# Patient Record
Sex: Female | Born: 1995 | Race: White | Hispanic: No | Marital: Single | Smoking: Current every day smoker
Health system: Southern US, Community
[De-identification: ages and names within clinical notes are randomized; demographics above are authoritative.]

## PROBLEM LIST (undated history)

## (undated) DIAGNOSIS — S83207A Unspecified tear of unspecified meniscus, current injury, left knee, initial encounter: Secondary | ICD-10-CM

## (undated) DIAGNOSIS — L219 Seborrheic dermatitis, unspecified: Secondary | ICD-10-CM

## (undated) DIAGNOSIS — F319 Bipolar disorder, unspecified: Secondary | ICD-10-CM

---
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
## 2016-11-30 ENCOUNTER — Encounter: Payer: Self-pay | Admitting: Emergency Medicine

## 2016-11-30 ENCOUNTER — Other Ambulatory Visit: Payer: Self-pay

## 2016-11-30 ENCOUNTER — Emergency Department
Admission: EM | Admit: 2016-11-30 | Discharge: 2016-11-30 | Disposition: A | Discharge status: Other Acute Inpatient Hospital | Payer: Self-pay | Attending: Emergency Medicine | Admitting: Emergency Medicine

## 2016-11-30 ENCOUNTER — Inpatient Hospital Stay
Admission: AD | Admit: 2016-11-30 | Discharge: 2016-12-08 | DRG: 885 | Disposition: A | Discharge status: Home / Self Care | Payer: No Typology Code available for payment source | Attending: Psychiatry | Admitting: Psychiatry

## 2016-11-30 ENCOUNTER — Encounter: Payer: Self-pay | Admitting: Psychiatry

## 2016-11-30 DIAGNOSIS — F2081 Schizophreniform disorder: Secondary | ICD-10-CM

## 2016-11-30 DIAGNOSIS — R4182 Altered mental status, unspecified: Secondary | ICD-10-CM | POA: Insufficient documentation

## 2016-11-30 DIAGNOSIS — F1220 Cannabis dependence, uncomplicated: Secondary | ICD-10-CM | POA: Diagnosis present

## 2016-11-30 DIAGNOSIS — F3164 Bipolar disorder, current episode mixed, severe, with psychotic features: Principal | ICD-10-CM | POA: Diagnosis present

## 2016-11-30 DIAGNOSIS — R4689 Other symptoms and signs involving appearance and behavior: Secondary | ICD-10-CM

## 2016-11-30 DIAGNOSIS — F410 Panic disorder [episodic paroxysmal anxiety] without agoraphobia: Secondary | ICD-10-CM | POA: Diagnosis not present

## 2016-11-30 DIAGNOSIS — Z62810 Personal history of physical and sexual abuse in childhood: Secondary | ICD-10-CM | POA: Diagnosis present

## 2016-11-30 DIAGNOSIS — G4700 Insomnia, unspecified: Secondary | ICD-10-CM | POA: Diagnosis present

## 2016-11-30 HISTORY — DX: Cannabis dependence, uncomplicated: F12.20

## 2016-11-30 LAB — URINE DRUG SCREEN, QUALITATIVE (ARMC ONLY)
Amphetamines, Ur Screen: NOT DETECTED
Barbiturates, Ur Screen: NOT DETECTED
Benzodiazepine, Ur Scrn: NOT DETECTED
CANNABINOID 50 NG, UR ~~LOC~~: POSITIVE — AB
COCAINE METABOLITE, UR ~~LOC~~: NOT DETECTED
MDMA (Ecstasy)Ur Screen: NOT DETECTED
METHADONE SCREEN, URINE: NOT DETECTED
OPIATE, UR SCREEN: NOT DETECTED
Phencyclidine (PCP) Ur S: NOT DETECTED
Tricyclic, Ur Screen: NOT DETECTED

## 2016-11-30 LAB — COMPREHENSIVE METABOLIC PANEL
ALBUMIN: 4.8 g/dL (ref 3.5–5.0)
ALT: 13 U/L — ABNORMAL LOW (ref 14–54)
ANION GAP: 7 (ref 5–15)
AST: 21 U/L (ref 15–41)
Alkaline Phosphatase: 54 U/L (ref 38–126)
BUN: 7 mg/dL (ref 6–20)
CO2: 24 mmol/L (ref 22–32)
Calcium: 9.7 mg/dL (ref 8.9–10.3)
Chloride: 107 mmol/L (ref 101–111)
Creatinine, Ser: 0.56 mg/dL (ref 0.44–1.00)
GFR calc Af Amer: >60 mL/min (ref >60)
GFR calc non Af Amer: >60 mL/min (ref >60)
GLUCOSE: 115 mg/dL — AB (ref 65–99)
POTASSIUM: 3.1 mmol/L — AB (ref 3.5–5.1)
SODIUM: 138 mmol/L (ref 135–145)
Total Bilirubin: 1.2 mg/dL (ref 0.3–1.2)
Total Protein: 8.2 g/dL — ABNORMAL HIGH (ref 6.5–8.1)

## 2016-11-30 LAB — CBC
HEMATOCRIT: 38.3 % (ref 35.0–47.0)
HEMOGLOBIN: 12.9 g/dL (ref 12.0–16.0)
MCH: 29.9 pg (ref 26.0–34.0)
MCHC: 33.8 g/dL (ref 32.0–36.0)
MCV: 88.6 fL (ref 80.0–100.0)
Platelets: 191 10*3/uL (ref 150–440)
RBC: 4.32 MIL/uL (ref 3.80–5.20)
RDW: 13.6 % (ref 11.5–14.5)
WBC: 7.9 10*3/uL (ref 3.6–11.0)

## 2016-11-30 LAB — ETHANOL: Alcohol, Ethyl (B): <10 mg/dL (ref <10)

## 2016-11-30 LAB — POCT PREGNANCY, URINE: Preg Test, Ur: NEGATIVE

## 2016-11-30 MED ORDER — ACETAMINOPHEN 325 MG PO TABS
650.0000 mg | ORAL_TABLET | Freq: Four times a day (QID) | ORAL | Status: DC | PRN
  Administered 2016-12-04 – 2016-12-06 (×3): 650 mg via ORAL
  Filled 2016-11-30 (×3): qty 2

## 2016-11-30 MED ORDER — HALOPERIDOL 5 MG PO TABS: 10.0000 mg | ORAL_TABLET | Freq: Four times a day (QID) | ORAL | Status: DC | PRN

## 2016-11-30 MED ORDER — QUETIAPINE FUMARATE 100 MG PO TABS: 100.0000 mg | ORAL_TABLET | Freq: Every day | ORAL | Status: DC

## 2016-11-30 MED ORDER — ALUM & MAG HYDROXIDE-SIMETH 200-200-20 MG/5ML PO SUSP
30.0000 mL | ORAL | Status: DC | PRN
Start: 2016-11-30 — End: 2016-12-08

## 2016-11-30 MED ORDER — MAGNESIUM HYDROXIDE 400 MG/5ML PO SUSP
30.0000 mL | Freq: Every day | ORAL | Status: DC | PRN
  Administered 2016-12-06: 30 mL via ORAL
  Filled 2016-11-30: qty 30

## 2016-11-30 MED ORDER — LORAZEPAM 2 MG PO TABS: 2.0000 mg | ORAL_TABLET | Freq: Four times a day (QID) | ORAL | Status: DC | PRN

## 2016-11-30 MED ORDER — ZIPRASIDONE MESYLATE 20 MG IM SOLR
20.0000 mg | Freq: Once | INTRAMUSCULAR | Status: AC
  Administered 2016-11-30: 20 mg via INTRAMUSCULAR

## 2016-11-30 MED ORDER — HALOPERIDOL LACTATE 5 MG/ML IJ SOLN
10.0000 mg | Freq: Four times a day (QID) | INTRAMUSCULAR | Status: DC | PRN
  Administered 2016-11-30 – 2016-12-01 (×2): 10 mg via INTRAMUSCULAR
  Filled 2016-11-30 (×2): qty 2

## 2016-11-30 MED ORDER — LORAZEPAM 2 MG/ML IJ SOLN
2.0000 mg | Freq: Once | INTRAMUSCULAR | Status: AC
  Administered 2016-11-30: 2 mg via INTRAMUSCULAR

## 2016-11-30 MED ORDER — DIPHENHYDRAMINE HCL 25 MG PO CAPS: 50.0000 mg | ORAL_CAPSULE | Freq: Four times a day (QID) | ORAL | Status: DC | PRN

## 2016-11-30 MED ORDER — LITHIUM CARBONATE 300 MG PO CAPS
300.0000 mg | ORAL_CAPSULE | Freq: Three times a day (TID) | ORAL | Status: DC
  Administered 2016-12-01: 300 mg via ORAL
  Filled 2016-11-30: qty 1

## 2016-11-30 MED ORDER — DIPHENHYDRAMINE HCL 50 MG/ML IJ SOLN
50.0000 mg | Freq: Four times a day (QID) | INTRAMUSCULAR | Status: DC | PRN
  Administered 2016-11-30 – 2016-12-01 (×2): 50 mg via INTRAMUSCULAR
  Filled 2016-11-30 (×2): qty 1

## 2016-11-30 MED ORDER — LORAZEPAM 2 MG/ML IJ SOLN
2.0000 mg | Freq: Four times a day (QID) | INTRAMUSCULAR | Status: DC | PRN
  Administered 2016-11-30 – 2016-12-01 (×2): 2 mg via INTRAMUSCULAR
  Filled 2016-11-30 (×2): qty 1

## 2016-11-30 MED ORDER — ZIPRASIDONE MESYLATE 20 MG IM SOLR
20.0000 mg | Freq: Once | INTRAMUSCULAR | Status: AC
  Administered 2016-11-30: 20 mg via INTRAMUSCULAR
  Filled 2016-11-30: qty 20

## 2016-11-30 NOTE — ED Notes (Signed)
Ambulatory to the BR - pt urinating in the bed   Encouraged her to go to the BR  - she walked independently to the BR

## 2016-11-30 NOTE — ED Provider Notes (Signed)
[facility] Emergency Department Provider Note   ____________________________________________   First MD Initiated Contact with Patient 11/30/16 0425     (approximate)  I have reviewed the triage vital signs and the nursing notes.   HISTORY  Chief Complaint Altered Mental Status    HPI Ruth Griffith is a 21 y.o. female Who comes into the hospital today with bizarre behavior. The patient goes to school in [place] and was picked up from school this morning. Since then she's been talking out of her head. The patient's parents state that someone gave her some drug but they're unsure exactly what it is. The patient states that she has been drinking as well. The patient has been yelling in saying that she needs to see a man about a truck. She also states that she knows about God and is very agitated. The patient states that she needs to find her brother because her brother is the only one who knows her. She also keeps saying that she has Engineer, manufacturing. The patient does not have fluid speech or fluid or process. According to the patient's mother she hasn't slept since Thursday and has been running around her dorm's. She states that she's been speaking this way since yesterday morning at about 8:30 AM. The patient was brought in for evaluation.   History reviewed. No pertinent past medical history.  There are no active problems to display for this patient.   History reviewed. No pertinent surgical history.  Prior to Admission medications   Not on File    Allergies Patient has no known allergies.  No family history on file.  Social History Social History  Substance Use Topics  . Smoking status: Not on file  . Smokeless tobacco: Not on file  . Alcohol use No    Review of Systems  unable to assess due to patient's agitated and altered state. ____________________________________________   PHYSICAL EXAM:  VITAL SIGNS: ED Triage Vitals  Enc  Vitals Group     BP 11/30/16 0437 (!) 157/95     Pulse Rate 11/30/16 0437 83     Resp 11/30/16 0437 (!) 26     Temp 11/30/16 0437 (!) 97.4 F (36.3 C)     Temp Source 11/30/16 0437 Oral     SpO2 11/30/16 0437 100 %     Weight 11/30/16 0438 148 lb (67.1 kg)     Height 11/30/16 0438 5\' 8"  (1.727 m)     Head Circumference --      Peak Flow --      Pain Score 11/30/16 0436 0     Pain Loc --      Pain Edu? --      Excl. in GC? --     Constitutional: Alert and disoriented, the patient's disheveled appearing and very agitated yelling and screaming and continually trying to jump out of bed. Eyes: Conjunctivae are normal. PERRL. EOMI. Head: Atraumatic. Nose: No congestion/rhinnorhea. Mouth/Throat: Mucous membranes are moist.  Oropharynx non-erythematous. Cardiovascular: Normal rate, regular rhythm. Grossly normal heart sounds.  Good peripheral circulation. Respiratory: Normal respiratory effort.  No retractions. Lungs CTAB. Gastrointestinal: Soft and nontender. No distention. positive bowel sounds Musculoskeletal: No lower extremity tenderness nor edema.  . Neurologic:  Normal speech and language.  Skin:  Skin is warm, dry and intact.  Psychiatric: Mood and affect are normal.   ____________________________________________   LABS (all labs ordered are listed, but only abnormal results are displayed)  Labs Reviewed  COMPREHENSIVE METABOLIC PANEL -  Abnormal; Notable for the following:       Result Value   Potassium 3.1 (*)    Glucose, Bld 115 (*)    Total Protein 8.2 (*)    ALT 13 (*)    All other components within normal limits  URINE DRUG SCREEN, QUALITATIVE ([facility] ONLY) - Abnormal; Notable for the following:    Cannabinoid 50 Ng, Ur [place] POSITIVE (*)    All other components within normal limits  CBC  ETHANOL  CBG MONITORING, ED  POCT PREGNANCY, URINE   ____________________________________________  EKG  none ____________________________________________  RADIOLOGY  No  results found.  ____________________________________________   PROCEDURES  Procedure(s) performed: None  Procedures  Critical Care performed: No  ____________________________________________   INITIAL IMPRESSION / ASSESSMENT AND PLAN / ED COURSE  As part of my medical decision making, I reviewed the following data within the electronic MEDICAL RECORD NUMBER Notes from prior ED visits and [place] Controlled Substance Database   This is a 21 year old female who comes into the hospital today with some agitation. The patient comes in and she has pressured speech with word salad and flight of ideas. The patient doesn't make any sense in her speech and she is very agitated. She keeps trying to come off the bed. the patient would not answer questions appropriately and did not make sense. I did give the patient a dose of Geodon and Ativan to help her be calm. I did involuntarily commit the patient and we did check her blood work.  My differential diagnosis does include substance abuse, alcoholism, acute psychotic break, manic episode.  The patient's blood work is unremarkable. I will await specialist consult      ____________________________________________   FINAL CLINICAL IMPRESSION(S) / ED DIAGNOSES  Final diagnoses:  Altered mental status, unspecified altered mental status type  Abnormal behavior      NEW MEDICATIONS STARTED DURING THIS VISIT:  New Prescriptions   No medications on file     Note:  This document was prepared using Dragon voice recognition software and may include unintentional dictation errors.    Rebecka ApleyWebster, Allison P, MD 11/30/16 [phone]

## 2016-11-30 NOTE — ED Notes (Signed)
BEHAVIORAL HEALTH ROUNDING Patient sleeping: Yes.   Patient alert and oriented: eyes closed  Appears to be asleep Behavior appropriate: Yes.  ; If no, describe:  Nutrition and fluids offered: Yes  Toileting and hygiene offered: sleeping Sitter present: q 15 minute observations and security monitoring Law enforcement present: yes  ODS 
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
## 2016-11-30 NOTE — ED Notes (Signed)
Pt's mother has called requesting to speak with her daughter or receive a call from the psychiatrist  - psychiatrist is currently in her room  Mothers number taken by the secretary   Ruth Griffith  [phone]

## 2016-11-30 NOTE — ED Notes (Signed)
BEHAVIORAL HEALTH ROUNDING Patient sleeping: No. Patient alert and oriented: yes Behavior appropriate: Yes.  ; If no, describe:  Nutrition and fluids offered: yes Toileting and hygiene offered: Yes  Sitter present: q15 minute observations and security  monitoring Law enforcement present: Yes  ODS  

## 2016-11-30 NOTE — ED Notes (Signed)
Awake and alert.  Patient confused, tearful.  States "I am confused.  I am worried my mom won't recognize me and won't know who I am."

## 2016-11-30 NOTE — ED Triage Notes (Signed)
Pt arrives via [facility] with c/o AMS. Pt is repeating that she is "savage" and that she "loves bacon". Per EMS, CBG 123. [place] and her parents called EMS.

## 2016-11-30 NOTE — ED Notes (Signed)
Patient moved to ED 23 for safety.

## 2016-11-30 NOTE — ED Notes (Signed)
Pt has been up and down from the bed - taking her clothes off and then acting as if she cannot remember how to put her clothes back  - walking to the BR then acting as if she has forgotten how to walk  Large BM in the restroom then stating  "I am not pregnant anymore because I just shit"   Bizarre - irratic behavior observed  Med to be administered

## 2016-11-30 NOTE — ED Notes (Addendum)
Admission EKG completed by this RN. This RN and security to walk pt to Lower Level. [facility] not [facility] nor in ED to be sent to lower level.

## 2016-11-30 NOTE — BH Assessment (Signed)
Pt sleeping and unable to participate in consult at this time. Per pt's RN Erskine Squibb(Jane) pt received IM medications. TTS to assess pt at later time.

## 2016-11-30 NOTE — ED Notes (Signed)
Pt is moving from laying on bed to standing and is repeatedly taking clothes off; pt has been asked multiple times to keep clothes on and relax on bed and will being going down to her admitting room down stairs.

## 2016-11-30 NOTE — Tx Team (Addendum)
Initial Treatment Plan 11/30/2016 11:44 PM Lenon OmsJenna Margie Griffith UEA:[id number]    PATIENT STRESSORS: Substance abuse   PATIENT STRENGTHS: Ability for insight Average or above average intelligence Capable of independent living Communication skills Physical Health Supportive family/friends Other: In college    PATIENT IDENTIFIED PROBLEMS: Substance abuse - smoked marijuana                     DISCHARGE CRITERIA:  Ability to meet basic life and health needs Adequate post-discharge living arrangements Improved stabilization in mood, thinking, and/or behavior  PRELIMINARY DISCHARGE PLAN: Outpatient therapy Return to previous living arrangement Return to previous work or school arrangements  PATIENT/FAMILY INVOLVEMENT: This treatment plan was not reviewed with this patient because she was disorganized and medicated resulting in her falling asleep.  Lenon OmsJenna Margie Griffith,.  The patient was not  been given the opportunity to ask questions and make suggestions.  Rex KrasJoanne  Mialynn Shelvin, RN 11/30/2016, 11:44 PM
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
## 2016-11-30 NOTE — ED Notes (Signed)
Awake  - ambulating in her room -  She had to be reminded to put her pants on before walking into the hallway   She independently put her clothes back on -  She then ambulated to the BR and turned the shower water on - soaking herself-  Clothing changed  - she independently changed herself and ambulated back to her room

## 2016-11-30 NOTE — Progress Notes (Signed)
Received Ruth Griffith from the ED, she is alert and very disorganized with difficulty to redirect. She was directed to her room, took a shower, constant calling via nurses call light, talked with her mother, walking around the unit in a disorganized manner, disruptive in the day room, and increased verbal tone. Her room was moved closer to the nurses unit and she received IM Haldol, Benadryl and Ativan per order. Security monitored her until the medication became effective and she fell asleep. Her admission data was obtained from the chart

## 2016-11-30 NOTE — BH Assessment (Addendum)
TTS interview completed. [facility] by Dr. Toni Amendlapacs.  Attending Physician will be Dr. Jennet MaduroPucilowska.   Patient has been [facility] to room 313, [facility] Gwen.   ER staff is aware of the admission Rivka Barbara( Glenda, ER Sect.; Dr. Darnelle CatalanMalinda, ER MD; Amy Patient's Nurse & Mertie ClauseJeanelle Patient Access).
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
## 2016-11-30 NOTE — ED Notes (Signed)
She has been knocking on her door constantly since returning to her room from the [facility]  - she requests a drink  - juice in plastic cup with no lid offered due to she chewed up the styrofoam cup we provided earlier  Continue to monitor closely

## 2016-11-30 NOTE — BH Assessment (Signed)
Pt BMU bed assignment changed from 313 to 312 per charge RN, Dedra SkeensGwen.

## 2016-11-30 NOTE — ED Provider Notes (Signed)
patient is having confused thinking she pulled her pants down in the room on the bed and urinated all over the place and then when she got to the bathroom was screaming that she had to go poop and having a lot of confused speech as well. I will give her another   Arnaldo NatalMalinda, Forestine Macho F, MD 11/30/16 [id number]

## 2016-12-01 DIAGNOSIS — F29 Unspecified psychosis not due to a substance or known physiological condition: Secondary | ICD-10-CM

## 2016-12-01 LAB — LIPID PANEL
CHOL/HDL RATIO: 2.2 ratio
CHOLESTEROL: 151 mg/dL (ref 0–200)
HDL: 68 mg/dL (ref >40)
LDL Cholesterol: 69 mg/dL (ref 0–99)
Triglycerides: 71 mg/dL (ref <150)
VLDL: 14 mg/dL (ref 0–40)

## 2016-12-01 LAB — HEMOGLOBIN A1C
Hgb A1c MFr Bld: 5 % (ref 4.8–5.6)
MEAN PLASMA GLUCOSE: 96.8 mg/dL

## 2016-12-01 LAB — TSH: TSH: 0.829 u[IU]/mL (ref 0.350–4.500)

## 2016-12-01 MED ORDER — OLANZAPINE 5 MG PO TBDP
15.0000 mg | ORAL_TABLET | Freq: Two times a day (BID) | ORAL | Status: DC
  Administered 2016-12-02: 15 mg via ORAL
  Filled 2016-12-01 (×2): qty 3

## 2016-12-01 MED ORDER — OLANZAPINE 5 MG PO TBDP
15.0000 mg | ORAL_TABLET | Freq: Once | ORAL | Status: AC
  Administered 2016-12-01: 15 mg via ORAL
  Filled 2016-12-01: qty 3

## 2016-12-01 MED ORDER — TEMAZEPAM 15 MG PO CAPS
15.0000 mg | ORAL_CAPSULE | Freq: Every day | ORAL | Status: DC
  Filled 2016-12-01: qty 1

## 2016-12-01 MED ORDER — ARIPIPRAZOLE 5 MG PO TABS
5.0000 mg | ORAL_TABLET | Freq: Every day | ORAL | Status: DC
  Filled 2016-12-01: qty 1

## 2016-12-01 MED ORDER — OLANZAPINE 10 MG IM SOLR: 10.0000 mg | Freq: Every day | INTRAMUSCULAR | Status: DC | PRN

## 2016-12-01 NOTE — [facility] Group Notes (Signed)
[facility] LCSW Group Therapy Note  Date/Time: 12/01/16, 1400  Type of Therapy/Topic:  Group Therapy:  Feelings about Diagnosis  Participation Level:  Active   Mood:pleasant   Description of Group:    This group will allow patients to explore their thoughts and feelings about diagnoses they have received. Patients will be guided to explore their level of understanding and acceptance of these diagnoses. Facilitator will encourage patients to process their thoughts and feelings about the reactions of others to their diagnosis, and will guide patients in identifying ways to discuss their diagnosis with significant others in their lives. This group will be process-oriented, with patients participating in exploration of their own experiences as well as giving and receiving support and challenge from other group members.   Therapeutic Goals: 1. Patient will demonstrate understanding of diagnosis as evidence by identifying two or more symptoms of the disorder:  2. Patient will be able to express two feelings regarding the diagnosis 3. Patient will demonstrate ability to communicate their needs through discussion and/or role plays  Summary of Patient Progress: Pt was very talkative in group and was quite knowledgeable about symptoms of bipolar disorder.  Pt identified her diagnoses as bipolar disorder and PTSD.  Pt was talking with other patients and had some difficulty staying focused.        Therapeutic Modalities:   Cognitive Behavioral Therapy Brief Therapy Feelings Identification   Daleen SquibbGreg Ruvi Fullenwider, LCSW
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
## 2016-12-01 NOTE — Progress Notes (Signed)
Recreation Therapy Notes  INPATIENT RECREATION THERAPY ASSESSMENT  Patient Details Name: Ruth Griffith MRN: [id number] DOB: 02/24/1993 Today's Date: 12/01/2016  Patient Stressors: Family, Relationship, Friends, Work, School   Patient states that she is concerned about her brother and is pregnant.   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)  Personal Challenges: Anger, Communication, Concentration, Expressing Yourself, Self-Esteem/Confidence  Leisure Interests (2+):   (Cleaning, Helping others)  Awareness of Community Resources:  No  Community Resources:   N/A  Current Use:  N/A  If no, Barriers?:  N/A  Patient Strengths:  Pension scheme managerainting, Shopping  Patient Identified Areas of Improvement:  Talking to others  Current Recreation Participation:  None  Patient Goal for Hospitalization:  Focus on self  [place]  [place] Residence:  [place]   Current SI (including self-harm):  No  Current HI:  No  Consent to Intern Participation: N/A   Deshay Kirstein 12/01/2016, 11:15 AM

## 2016-12-01 NOTE — Progress Notes (Signed)
Pt was exit seeking and refusing redirection, she is hperactive, pacing up and down the hall She emptied her trash on the floor. At 2000 was given IM Haldol, benadryl and ativan. She had snack and was in bed sleeping by 2100. Was not awakened for HS meds.

## 2016-12-01 NOTE — Progress Notes (Signed)
Ruth Griffith slept after the medication injection and remained asleep, she slept 7 hrs and 15 min.

## 2016-12-01 NOTE — Progress Notes (Signed)
Recreation Therapy Notes   Time: 9:30am  Location: Craft Room  Behavioral response: Hyperactive  Intervention Topic: Team building  Discussion/Intervention: Group content on today was focused on team building. The group identified what team building is. Individuals described who is a part of their team. Patients expressed why they thought team building is important. The group stated reasons why they thought it was easier to work with a Comptrollersmaller/larger team. Individuals discussed some positives and negatives of working with a team. Patients gave examples of past experiences they had while working with a team. The group participated in the intervention "Save the TocoBalls", patients were in groups and had to keep the balls on the surface given.   Clinical Observations/Feedback:  Patient came to group and was engaged with staff and peers. She identified team building as building as a team. Individual expressed that she would prefer to work in a bigger team to get more done. Patient became hyperactive during group and had to redirected by staff several times.   Emon Lance LRT/CTRS        Athol Bolds 12/01/2016 11:38 AM

## 2016-12-01 NOTE — Progress Notes (Signed)
Patient ID: Ruth Griffith, female   DOB: 02/24/1993, [age] y.o.   MRN: [id number]  Ruth Griffith is now giddy, intrussive, disorganized. She has been going to other patients' rooms taking their belongings. She is singing loudly. She refused lab work and medications twice today.   We will initiate forced medications protocol and start Zyprexa Zydis 15 mg twice daily. We will give Zyprexa 10 mg IM if oral Zyprexa is refused.

## 2016-12-01 NOTE — [facility] Suicide Risk Assessment (Signed)
[facility] Admission Suicide Risk Assessment   Nursing information obtained from:    Demographic factors:    Current Mental Status:    Loss Factors:    Historical Factors:    Risk Reduction Factors:     Total Time spent with patient: 1 hour Principal Problem: Schizophrenia spectrum disorder with psychotic disorder type not yet determined [facility]) Diagnosis:   Patient Active Problem List   Diagnosis Date Noted  . Schizophreniform disorder ([facility]) [F20.81] 11/30/2016  . Schizophrenia spectrum disorder with psychotic disorder type not yet determined ([facility]) [F29] 11/30/2016  . Cannabis use disorder, moderate, dependence ([facility]) [F12.20] 11/30/2016   Subjective Data: psychotic break.  Continued Clinical Symptoms:    The "Alcohol Use Disorders Identification Test", Guidelines for Use in Primary Care, Second Edition.  World Science writer [facility]). Score between 0-7:  no or low risk or alcohol related problems. Score between 8-15:  moderate risk of alcohol related problems. Score between 16-19:  high risk of alcohol related problems. Score 20 or above:  warrants further diagnostic evaluation for alcohol dependence and treatment.   CLINICAL FACTORS:   Bipolar Disorder:   Mixed State Currently Psychotic   Musculoskeletal: Strength & Muscle Tone: within normal limits Gait & Station: normal Patient leans: N/A  Psychiatric Specialty Exam: Physical Exam  Nursing note and vitals reviewed. Psychiatric: Her affect is labile. Her speech is delayed. Thought content is paranoid and delusional. Cognition and memory are impaired. She expresses impulsivity.    Review of Systems  Psychiatric/Behavioral: Positive for hallucinations and substance abuse. The patient is nervous/anxious.   All other systems reviewed and are negative.   Blood pressure (!) 153/99, pulse 97, temperature 98 F (36.7 C), temperature source Oral, resp. rate 20, height 5\' 8"  (1.727 m), weight 60.8 kg (134 lb).Body mass index is  20.37 kg/m.  General Appearance: Fairly Groomed  Eye Contact:  Minimal  Speech:  Slow  Volume:  Decreased  Mood:  Anxious  Affect:  Congruent  Thought Process:  Disorganized and Descriptions of Associations: Tangential  Orientation:  Full (Time, Place, and Person)  Thought Content:  Delusions, Hallucinations: Auditory and Paranoid Ideation  Suicidal Thoughts:  No  Homicidal Thoughts:  No  Memory:  Immediate;   Poor Recent;   Poor Remote;   Poor  Judgement:  Poor  Insight:  Lacking  Psychomotor Activity:  Increased  Concentration:  Concentration: Poor and Attention Span: Poor  Recall:  Poor  Fund of Knowledge:  Fair  Language:  Fair  Akathisia:  No  Handed:  Right  AIMS (if indicated):     Assets:  Communication Skills Desire for Improvement Financial Resources/Insurance Housing Physical Health Resilience Social Support  ADL's:  Intact  Cognition:  WNL  Sleep:  Number of Hours: 7.15      COGNITIVE FEATURES THAT CONTRIBUTE TO RISK:  None    SUICIDE RISK:   Moderate:  Frequent suicidal ideation with limited intensity, and duration, some specificity in terms of plans, no associated intent, good self-control, limited dysphoria/symptomatology, some risk factors present, and identifiable protective factors, including available and accessible social support.  PLAN OF CARE: Hospital admission, medication, substance abuse counseling, discharge planning,  Ms. Lamar Benes is a 21 year old female with no past psychiatric history admitted for psychotic break.  # Agitation -Haldol, Ativan and Benadryl PO and IM are available  # Psychosis, likely bipolar mania - started Lithium 300 mg TID - started Abilify 5 mg today - Head CT scan  # Insomnia - we start Restoril 15  mg nightly  # substance abuse - positive for cannabis  # Metabolic syndrome monitoring - Lipid panel, TSH and HgbA1C are pending - EKG, pending  - Pregnancy test is negative  # Disposition - Discharge  with family - She needs psychiatric appointment  I certify that inpatient services furnished can reasonably be expected to improve the patient's condition.   Kristine LineaJolanta Bret Vanessen, MD 12/01/2016, 8:51 AM

## 2016-12-01 NOTE — Plan of Care (Signed)
Problem: Education: Goal: Will be free of psychotic symptoms Outcome: Not Progressing Patient remains mentally unstable at this time. Goal: Knowledge of the prescribed therapeutic regimen will improve Outcome: Not Progressing Patient is refusing to take her meds.  Problem: Coping: Goal: Ability to cope will improve Outcome: Not Progressing Patient is not coping well in the environment. Goal: Ability to verbalize feelings will improve Outcome: Not Progressing Patient is not able to verbalize feelings at this time. Confusion is noted.  Problem: Safety: Goal: Ability to remain free from injury will improve Outcome: Progressing Patient remains free from injury.  Problem: Education: Goal: Mental status will improve Outcome: Not Progressing Patient continues to display mental instability and confusion.

## 2016-12-01 NOTE — Progress Notes (Signed)
Patient continues to be confused with bizarre behavior. Oriented to self only at this time. Medication not taken by patient this a.m. Continues to say, "This is bad for my body." Disorganized thought content noted. Ambulates the unit without any problems. Encouraged to attend group but participation was not done. Ate about 25% of breakfast this morning. Will continue to monitor for safety.

## 2016-12-01 NOTE — [facility]HH Group Notes (Signed)
LCSW Group Therapy Note 12/01/2016 9:00am  Type of Therapy and Topic:  Group Therapy:  Setting Goals  Participation Level:  Active  Description of Group: In this process group, patients discussed using strengths to work toward goals and address challenges.  Patients identified two positive things about themselves and one goal they were working on.  Patients were given the opportunity to share openly and support each other's plan for self-empowerment.  The group discussed the value of gratitude and were encouraged to have a daily reflection of positive characteristics or circumstances.  Patients were encouraged to identify a plan to utilize their strengths to work on current challenges and goals.  Therapeutic Goals 1. Patient will verbalize personal strengths/positive qualities and relate how these can assist with achieving desired personal goals 2. Patients will verbalize affirmation of peers plans for personal change and goal setting 3. Patients will explore the value of gratitude and positive focus as related to successful achievement of goals 4. Patients will verbalize a plan for regular reinforcement of personal positive qualities and circumstances.  Summary of Patient Progress:  Pt is unable to meet therapeutic goals at this time.  She remains manic.  Requiring some redirection from touching others, put her hand on another Pt's shoulder.  In and out of group, several times.  Some inappropriate conversations about bodily functions, etc.     Therapeutic Modalities Cognitive Behavioral Therapy Motivational Interviewing    Glennon MacSara P Mischell [place], LCSW 12/01/2016 11:23 AM

## 2016-12-02 MED ORDER — OLANZAPINE 5 MG PO TBDP
15.0000 mg | ORAL_TABLET | Freq: Two times a day (BID) | ORAL | Status: DC
  Administered 2016-12-02 – 2016-12-07 (×10): 15 mg via ORAL
  Filled 2016-12-02 (×10): qty 3

## 2016-12-02 MED ORDER — OLANZAPINE 10 MG IM SOLR
10.0000 mg | Freq: Two times a day (BID) | INTRAMUSCULAR | Status: DC
  Filled 2016-12-02: qty 10

## 2016-12-02 NOTE — Progress Notes (Signed)
Recreation Therapy Notes   Date: 10.31.18  Time: 3:00pm  Location: Outside  Behavioral response: N/A  Group Type: Leisure   Participation level: N/A  Communication: Patient did not attend group.  Comments: N/A  [name] LRT/CTRS        [name] 12/02/2016 4:03 PM

## 2016-12-02 NOTE — [facility] Group Notes (Signed)
  [facility] LCSW Group Therapy Note  Date/Time: 12/02/16, 1300  Type of Therapy/Topic:  Group Therapy:  Emotion Regulation  Participation Level:  Active   Mood: pleasant  Description of Group:    The purpose of this group is to assist patients in learning to regulate negative emotions and experience positive emotions. Patients will be guided to discuss ways in which they have been vulnerable to their negative emotions. These vulnerabilities will be juxtaposed with experiences of positive emotions or situations, and patients challenged to use positive emotions to combat negative ones. Special emphasis will be placed on coping with negative emotions in conflict situations, and patients will process healthy conflict resolution skills.  Therapeutic Goals: 1. Patient will identify two positive emotions or experiences to reflect on in order to balance out negative emotions:  2. Patient will label two or more emotions that they find the most difficult to experience:  3. Patient will be able to demonstrate positive conflict resolution skills through discussion or role plays:   Summary of Patient Progress: Pt identified fear and shame as emotions that are difficult for her to experience.  Pt did make a number of comments to the group discussion, some more on topic and some off topic.  Pt was attentive in group but had to leave 2-3 times saying she had to use the bathroom and then returning.         Therapeutic Modalities:   Cognitive Behavioral Therapy Feelings Identification Dialectical Behavioral Therapy  Daleen SquibbGreg Annsley Akkerman, LCSW

## 2016-12-02 NOTE — Plan of Care (Signed)
Problem: Coping: Goal: Ability to cope will improve Outcome: Not Progressing Patient is delusional and disorganized.  Problem: Safety: Goal: Ability to remain free from injury will improve Outcome: Progressing Remained free of injury.

## 2016-12-02 NOTE — Patient Care Conference (Signed)
Patient was sad,tearful and very disorganized this morning.Took her medicine with much encouragement.Attended some groups.Took evening medicine with no problem.Appropriate with staff & peers.Appetite and energy level good.Support and encouragement given.

## 2016-12-02 NOTE — Progress Notes (Signed)
Recreation Therapy Notes  Date: 10.31.18  Time: 9:30am  Location: Craft Room  Behavioral response: Hyperactive  Intervention Topic: Emotions  Discussion/Intervention: Group content on today was focused on emotions. The group identified what emotions are and why it is important to have emotions. Patients expressed some positive and negative emotions. Individuals gave some past experiences on how they normally dealt with emotions in the past. The group described some positive ways to deal with emotions in the future. Patients participated in the intervention "Shaded Emotions" where a story was read to the group as they colored in what feelings they were feeling into a coloring sheet.  Clinical Observations/Feedback:  Patient came to group and had to be redirected by staff several times to stay on topic and focus. Individual described emotions as how someone feels. She identified she feels positive emotions when she is helping others.  Analisa Sledd LRT/CTRS        Delancey Moraes 12/02/2016 11:40 AM

## 2016-12-02 NOTE — Tx Team (Signed)
Interdisciplinary Treatment and Diagnostic Plan Update  12/02/2016 Time of Session: 10:30am Jannatul Wojdyla MRN: [id number]  Principal Diagnosis: Schizophrenia spectrum disorder with psychotic disorder type not yet determined [facility])  Secondary Diagnoses: Principal Problem:   Schizophrenia spectrum disorder with psychotic disorder type not yet determined (HCC) Active Problems:   Cannabis use disorder, moderate, dependence (HCC)   Current Medications:  Current Facility-Administered Medications  Medication Dose Route Frequency Provider Last Rate Last Dose  . acetaminophen (TYLENOL) tablet 650 mg  650 mg Oral Q6H PRN Clapacs, John T, MD      . alum & mag hydroxide-simeth (MAALOX/MYLANTA) 200-200-20 MG/5ML suspension 30 mL  30 mL Oral Q4H PRN Clapacs, John T, MD      . diphenhydrAMINE (BENADRYL) capsule 50 mg  50 mg Oral Q6H PRN Pucilowska, Jolanta B, MD       Or  . diphenhydrAMINE (BENADRYL) injection 50 mg  50 mg Intramuscular Q6H PRN Pucilowska, Jolanta B, MD   50 mg at 12/01/16 1954  . haloperidol (HALDOL) tablet 10 mg  10 mg Oral Q6H PRN Pucilowska, Jolanta B, MD       Or  . haloperidol lactate (HALDOL) injection 10 mg  10 mg Intramuscular Q6H PRN Pucilowska, Jolanta B, MD   10 mg at 12/01/16 1955  . LORazepam (ATIVAN) tablet 2 mg  2 mg Oral Q6H PRN Pucilowska, Jolanta B, MD       Or  . LORazepam (ATIVAN) injection 2 mg  2 mg Intramuscular Q6H PRN Pucilowska, Jolanta B, MD   2 mg at 12/01/16 1955  . magnesium hydroxide (MILK OF MAGNESIA) suspension 30 mL  30 mL Oral Daily PRN Clapacs, John T, MD      . OLANZapine (ZYPREXA) injection 10 mg  10 mg Intramuscular Daily PRN Pucilowska, Jolanta B, MD      . OLANZapine zydis (ZYPREXA) disintegrating tablet 15 mg  15 mg Oral BID AC & HS Pucilowska, Jolanta B, MD   15 mg at 12/02/16 0824  . temazepam (RESTORIL) capsule 15 mg  15 mg Oral QHS Pucilowska, Jolanta B, MD       PTA Medications: No prescriptions prior to admission.     Patient Stressors: Substance abuse  Patient Strengths: Ability for insight Average or above average intelligence Capable of independent living Communication skills Physical Health Supportive family/friends Other: In college   Treatment Modalities: Medication Management, Group therapy, Case management,  1 to 1 session with clinician, Psychoeducation, Recreational therapy.   Physician Treatment Plan for Primary Diagnosis: Schizophrenia spectrum disorder with psychotic disorder type not yet determined (HCC) Long Term Goal(s): Improvement in symptoms so as ready for discharge Improvement in symptoms so as ready for discharge   Short Term Goals: Ability to identify changes in lifestyle to reduce recurrence of condition will improve Ability to verbalize feelings will improve Ability to disclose and discuss suicidal ideas Ability to demonstrate self-control will improve Ability to identify and develop effective coping behaviors will improve Compliance with prescribed medications will improve Ability to identify triggers associated with substance abuse/mental health issues will improve Ability to identify changes in lifestyle to reduce recurrence of condition will improve Ability to demonstrate self-control will improve Ability to identify triggers associated with substance abuse/mental health issues will improve  Medication Management: Evaluate patient's response, side effects, and tolerance of medication regimen.  Therapeutic Interventions: 1 to 1 sessions, Unit Group sessions and Medication administration.  Evaluation of Outcomes: Progressing  Physician Treatment Plan for Secondary Diagnosis: Principal Problem:   Schizophrenia  spectrum disorder with psychotic disorder type not yet determined (HCC) Active Problems:   Cannabis use disorder, moderate, dependence (HCC)  Long Term Goal(s): Improvement in symptoms so as ready for discharge Improvement in symptoms so as ready for  discharge   Short Term Goals: Ability to identify changes in lifestyle to reduce recurrence of condition will improve Ability to verbalize feelings will improve Ability to disclose and discuss suicidal ideas Ability to demonstrate self-control will improve Ability to identify and develop effective coping behaviors will improve Compliance with prescribed medications will improve Ability to identify triggers associated with substance abuse/mental health issues will improve Ability to identify changes in lifestyle to reduce recurrence of condition will improve Ability to demonstrate self-control will improve Ability to identify triggers associated with substance abuse/mental health issues will improve     Medication Management: Evaluate patient's response, side effects, and tolerance of medication regimen.  Therapeutic Interventions: 1 to 1 sessions, Unit Group sessions and Medication administration.  Evaluation of Outcomes: Progressing   RN Treatment Plan for Primary Diagnosis: Schizophrenia spectrum disorder with psychotic disorder type not yet determined (HCC) Long Term Goal(s): Knowledge of disease and therapeutic regimen to maintain health will improve  Short Term Goals: Ability to demonstrate self-control, Ability to identify and develop effective coping behaviors will improve and Compliance with prescribed medications will improve  Medication Management: RN will administer medications as ordered by provider, will assess and evaluate patient's response and provide education to patient for prescribed medication. RN will report any adverse and/or side effects to prescribing provider.  Therapeutic Interventions: 1 on 1 counseling sessions, Psychoeducation, Medication administration, Evaluate responses to treatment, Monitor vital signs and CBGs as ordered, Perform/monitor CIWA, COWS, AIMS and Fall Risk screenings as ordered, Perform wound care treatments as ordered.  Evaluation of  Outcomes: Progressing   LCSW Treatment Plan for Primary Diagnosis: Schizophrenia spectrum disorder with psychotic disorder type not yet determined (HCC) Long Term Goal(s): Safe transition to appropriate next level of care at discharge, Engage patient in therapeutic group addressing interpersonal concerns.  Short Term Goals: Engage patient in aftercare planning with referrals and resources, Facilitate acceptance of mental health diagnosis and concerns and Increase skills for wellness and recovery  Therapeutic Interventions: Assess for all discharge needs, 1 to 1 time with Social worker, Explore available resources and support systems, Assess for adequacy in community support network, Educate family and significant other(s) on suicide prevention, Complete Psychosocial Assessment, Interpersonal group therapy.  Evaluation of Outcomes: Progressing   Progress in Treatment: Attending groups: Yes. Participating in groups: Yes. Taking medication as prescribed: No. and As evidenced by:  refusing medication frequently.  Requiring forced medications. Toleration medication: Yes. Family/Significant other contact made: No, will contact:   Mother Patient understands diagnosis: Yes. Limited insight Discussing patient identified problems/goals with staff: Yes. Medical problems stabilized or resolved: Yes. Denies suicidal/homicidal ideation: Yes. Issues/concerns per patient self-inventory: No. Other:    New problem(s) identified: No, Describe:     New Short Term/Long Term Goal(s): to be discharged  Discharge Plan or Barriers: Return home with outpatient follow up TBD  Reason for Continuation of Hospitalization: Mania Medication stabilization  Estimated Length of Stay:  5-7 days  Attendees: Patient: Kezia Benevides 12/02/2016 11:24 AM  Physician: Braulio Conte Pucilowska 12/02/2016 11:24 AM  Nursing: Leonia Reader, RN 12/02/2016 11:24 AM  RN Care Manager: 12/02/2016 11:24 AM  Social Worker: Jake Shark,  LCSW 12/02/2016 11:24 AM  Recreational Therapist: Garret Reddish, LRT 12/02/2016 11:24 AM  Other:  12/02/2016 11:24 AM  Other:  12/02/2016 11:24 AM  Other: 12/02/2016 11:24 AM    Scribe for Treatment Team: Glennon MacSara P Hilbert Briggs, LCSW 12/02/2016 11:24 AM

## 2016-12-02 NOTE — Progress Notes (Signed)
D: Patient denies SI/HI/AVH. Patient was very pleasant during assessment. Patient question about new medication she is on and if she will have to continue to get shots, and she is feeling better today. Pt also states groups are helpful and she had a great session with therapy.  A: Staff to monitor Q 15 mins for safety. Encouragement and support offered. Scheduled medications administered per orders. R: Patient remains safe on the unit. Patient attended group tonight. Patient visible on the unit and interacting with peers. Patient taking administered medications.

## 2016-12-02 NOTE — Progress Notes (Signed)
[facility] for Medication Administration to Non-consenting Patients (For Involuntarily Committed Patients)  Patient: Lenon OmsJenna Margie Alverado Date of Birth: [birth year] MRN: [id number]  Reason for the Medication: The patient, without the benefit of the specific treatment measure, is incapable of participating in any available treatment plan that will give the patient a realistic opportunity of improving the patient's condition. There is, without the benefit of the specific treatment measure, a significant possibility that the patient will harm self or others before improvement of the patient's condition is realized.  Consideration of Side Effects: Consideration of the side effects related to the medication plan has been given.  Rationale for Medication Administration: Patient is displaying symptoms of psychosis with extremely disorganized thinking, paranoia, evident hallucinations and delusions.  Her behavior is disorganized and agitated in a manner that is potentially dangerous to herself or those around her.  Her condition has not shown improvement for the last few days and is not likely to improve without consistently administered antipsychotic medication.  Patient lacks the ability to rationally consider risks and benefits of medication.    Mordecai RasmussenJohn Clapacs, MD 12/02/16  1:58 PM   This documentation is good for (7) seven days from the date of the MD signature. New documentation must be completed every seven (7) days with detailed justification in the medical record if the patient requires continued non-emergent administration of psychotropic medications.

## 2016-12-02 NOTE — Progress Notes (Addendum)
[facility] MD Progress Note  12/02/2016 11:35 AM Ruth Griffith  MRN:  [id number]  Subjective:   Ruth Griffith met with treatment team today. She is terribly disorganized in her thinking, paranoid, delusional and hallucinating. She is also tearful and depressed most likley in mixed episode. She was given Haldol injection last night for agitation and disrobing. She takes medications only with outmost encouragement. Zyprexa Zydis and Zyprexa injections are available.  Spoke with the mother extensively. Family supportive but not entirely convinced about bipolar diagnosis.  Treatment plan. We will continue Zyprexa for psychosis and try to give Lithium for mood stabilization. Forced medication order is in place.  Social/disposition. The patient will be discharged with family. She will not return to school this semester. She was about to graduate with associate degree in December. She will follow up with [employer].  Principal Problem: Schizophrenia spectrum disorder with psychotic disorder type not yet determined [facility]) Diagnosis:   Patient Active Problem List   Diagnosis Date Noted  . Schizophreniform disorder ([place]) [F20.81] 11/30/2016  . Schizophrenia spectrum disorder with psychotic disorder type not yet determined ([place]) [F29] 11/30/2016  . Cannabis use disorder, moderate, dependence ([place]) [F12.20] 11/30/2016   Total Time spent with patient: 30 minutes  Past Psychiatric History: None.  Past Medical History: History reviewed. No pertinent past medical history. History reviewed. No pertinent surgical history. Family History: History reviewed. No pertinent family history. Family Psychiatric  History: Sister with a history of psychotic break while in college, grandmother with seasonal depression. Social History:  History  Alcohol Use No     History  Drug Use  . Types: Marijuana    Comment: Unknown related to AMS    Social History   Social History  . Marital status: Single    Spouse name:  N/A  . Number of children: N/A  . Years of education: N/A   Social History Main Topics  . Smoking status: Unknown If Ever Smoked  . Smokeless tobacco: None  . Alcohol use No  . Drug use: Yes    Types: Marijuana     Comment: Unknown related to AMS  . Sexual activity: Not Asked     Comment: Unknown   Other Topics Concern  . None   Social History Narrative  . None   Additional Social History:                         Sleep: Fair  Appetite:  Poor  Current Medications: Current Facility-Administered Medications  Medication Dose Route Frequency Provider Last Rate Last Dose  . acetaminophen (TYLENOL) tablet 650 mg  650 mg Oral Q6H PRN Clapacs, John T, MD      . alum & mag hydroxide-simeth (MAALOX/MYLANTA) 200-200-20 MG/5ML suspension 30 mL  30 mL Oral Q4H PRN Clapacs, John T, MD      . diphenhydrAMINE (BENADRYL) capsule 50 mg  50 mg Oral Q6H PRN Rohnan Bartleson B, MD       Or  . diphenhydrAMINE (BENADRYL) injection 50 mg  50 mg Intramuscular Q6H PRN Devere Brem B, MD   50 mg at 12/01/16 1954  . haloperidol (HALDOL) tablet 10 mg  10 mg Oral Q6H PRN Vandell Kun B, MD       Or  . haloperidol lactate (HALDOL) injection 10 mg  10 mg Intramuscular Q6H PRN Kirby Cortese B, MD   10 mg at 12/01/16 1955  . LORazepam (ATIVAN) tablet 2 mg  2 mg Oral Q6H PRN Latia Mataya,  Wardell Honour, MD       Or  . LORazepam (ATIVAN) injection 2 mg  2 mg Intramuscular Q6H PRN Na Waldrip B, MD   2 mg at 12/01/16 1955  . magnesium hydroxide (MILK OF MAGNESIA) suspension 30 mL  30 mL Oral Daily PRN Clapacs, John T, MD      . OLANZapine (ZYPREXA) injection 10 mg  10 mg Intramuscular Daily PRN Issak Goley B, MD      . OLANZapine zydis (ZYPREXA) disintegrating tablet 15 mg  15 mg Oral BID AC & HS Jasmane Brockway B, MD   15 mg at 12/02/16 0824  . temazepam (RESTORIL) capsule 15 mg  15 mg Oral QHS Terren Haberle B, MD        Lab Results:  Results for orders  placed or performed during the hospital encounter of 11/30/16 (from the past 48 hour(s))  Hemoglobin A1c     Status: None   Collection Time: 12/01/16 11:27 AM  Result Value Ref Range   Hgb A1c MFr Bld 5.0 4.8 - 5.6 %    Comment: (NOTE) Pre diabetes:          5.7%-6.4% Diabetes:              >6.4% Glycemic control for   <7.0% adults with diabetes    Mean Plasma Glucose 96.8 mg/dL    Comment: Performed at [facility], [place] [address], [place], [place] [address]  Lipid panel     Status: None   Collection Time: 12/01/16 11:27 AM  Result Value Ref Range   Cholesterol 151 0 - 200 mg/dL   Triglycerides 71 <150 mg/dL   HDL 68 >40 mg/dL   Total CHOL/HDL Ratio 2.2 RATIO   VLDL 14 0 - 40 mg/dL   LDL Cholesterol 69 0 - 99 mg/dL    Comment:        Total Cholesterol/HDL:CHD Risk Coronary Heart Disease Risk Table                     Men   Women  1/2 Average Risk   3.4   3.3  Average Risk       5.0   4.4  2 X Average Risk   9.6   7.1  3 X Average Risk  23.4   11.0        Use the calculated Patient Ratio above and the CHD Risk Table to determine the patient's CHD Risk.        ATP III CLASSIFICATION (LDL):  <100     mg/dL   Optimal  100-129  mg/dL   Near or Above                    Optimal  130-159  mg/dL   Borderline  160-189  mg/dL   High  >190     mg/dL   Very High   TSH     Status: None   Collection Time: 12/01/16 11:27 AM  Result Value Ref Range   TSH 0.829 0.350 - 4.500 uIU/mL    Comment: Performed by a 3rd Generation assay with a functional sensitivity of <=0.01 uIU/mL.    Blood Alcohol level:  Lab Results  Component Value Date   ETH <10 58/52/7782    Metabolic Disorder Labs: Lab Results  Component Value Date   HGBA1C 5.0 12/01/2016   MPG 96.8 12/01/2016   No results found for: PROLACTIN Lab Results  Component Value Date  CHOL 151 12/01/2016   TRIG 71 12/01/2016   HDL 68 12/01/2016   CHOLHDL 2.2 12/01/2016   VLDL 14 12/01/2016   LDLCALC 69 12/01/2016     Physical Findings: AIMS: Facial and Oral Movements Muscles of Facial Expression: None, normal Lips and Perioral Area: None, normal Jaw: None, normal Tongue: None, normal,Extremity Movements Upper (arms, wrists, hands, fingers): None, normal Lower (legs, knees, ankles, toes): None, normal, Trunk Movements Neck, shoulders, hips: None, normal, Overall Severity Severity of abnormal movements (highest score from questions above): None, normal Incapacitation due to abnormal movements: None, normal Patient's awareness of abnormal movements (rate only patient's report): No Awareness, Dental Status Current problems with teeth and/or dentures?: No Does patient usually wear dentures?: No  CIWA:    COWS:     Musculoskeletal: Strength & Muscle Tone: within normal limits Gait & Station: normal Patient leans: N/A  Psychiatric Specialty Exam: Physical Exam  Nursing note and vitals reviewed. Psychiatric: Her speech is normal. Her affect is labile. She is actively hallucinating. Thought content is paranoid and delusional. Cognition and memory are impaired. She expresses impulsivity.    Review of Systems  Neurological: Negative.   Psychiatric/Behavioral: Positive for depression, hallucinations and substance abuse. The patient is nervous/anxious.   All other systems reviewed and are negative.   Blood pressure (!) 153/99, pulse 97, temperature 98 F (36.7 C), temperature source Oral, resp. rate 20, height 5' 8"  (1.727 m), weight 60.8 kg (134 lb).Body mass index is 20.37 kg/m.  General Appearance: Casual  Eye Contact:  Good  Speech:  Clear and Coherent  Volume:  Decreased  Mood:  Anxious and Depressed  Affect:  Inappropriate and Labile  Thought Process:  Disorganized and Descriptions of Associations: Loose  Orientation:  Full (Time, Place, and Person)  Thought Content:  Illogical, Delusions, Hallucinations: Auditory and Paranoid Ideation  Suicidal Thoughts:  No  Homicidal Thoughts:  No   Memory:  Immediate;   Fair Recent;   Fair Remote;   Fair  Judgement:  Poor  Insight:  Lacking  Psychomotor Activity:  Increased  Concentration:  Concentration: Fair and Attention Span: Fair  Recall:  [employer] of Knowledge:  Fair  Language:  Fair  Akathisia:  No  Handed:  Left  AIMS (if indicated):     Assets:  Communication Skills Desire for Improvement Housing Physical Health Resilience Social Support Transportation Vocational/Educational  ADL's:  Intact  Cognition:  WNL  Sleep:  Number of Hours: 8     Treatment Plan Summary: Daily contact with patient to assess and evaluate symptoms and progress in treatment and Medication management   Ruth Griffith is a 21 year old female with no past psychiatric history admitted for psychotic break, most likely mixed bipolar episode. She is still disorganized, labile and psychotic. Refuses medications.  # Agitation -Haldol, Ativan and Benadryl PO and IM are available  # Psychosis, likely bipolar mixed, not improving - started Lithium 300 mg TID but the patient refuses - started Abilify but the patient refused - Zyprexa zydis 15 mg BID or 10 mg IM per non emergency forced mediation order - Head CT scan, pending, patient too psychotic  # Insomnia, improved - slept better we start Restoril 15 mg nightly  # substance abuse - positive for cannabis  # Metabolic syndrome monitoring - Lipid panel, TSH and HgbA1C are normal - EKG, QTc 428  - Pregnancy test is negative  # Disposition - Discharge with family - She needs psychiatric appointment    Orson Slick, MD  12/02/2016, 11:35 AM

## 2016-12-02 NOTE — [facility] Suicide Risk Assessment (Signed)
[facility] INPATIENT:  Family/Significant Other Suicide Prevention Education  Suicide Prevention Education:  Education Completed; Thelma Bargedriana Lawton, Mother, [phone],  (name of family member/significant other) has been identified by the patient as the family member/significant other with whom the patient will be residing, and identified as the person(s) who will aid the patient in the event of a mental health crisis (suicidal ideations/suicide attempt).  With written consent from the patient, the family member/significant other has been provided the following suicide prevention education, prior to the and/or following the discharge of the patient.  The suicide prevention education provided includes the following:  Suicide risk factors  Suicide prevention and interventions  National Suicide Hotline telephone number  [facility] assessment telephone number  [facility] Emergency Assistance 911  [facility] and/or Residential Mobile Crisis Unit telephone number  Request made of family/significant other to:  Remove weapons (e.g., guns, rifles, knives), all items previously/currently identified as safety concern.    Remove drugs/medications (over-the-counter, prescriptions, illicit drugs), all items previously/currently identified as a safety concern.  The family member/significant other verbalizes understanding of the suicide prevention education information provided.  The family member/significant other agrees to remove the items of safety concern listed above.  Cleda DaubSara P Micaella Gitto, LCSW 12/02/2016, 3:55 PM

## 2016-12-03 MED ORDER — TEMAZEPAM 15 MG PO CAPS
15.0000 mg | ORAL_CAPSULE | Freq: Every evening | ORAL | Status: DC | PRN
  Administered 2016-12-03: 15 mg via ORAL
  Filled 2016-12-03: qty 1
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
## 2016-12-03 NOTE — [facility] Group Notes (Signed)
[facility] LCSW Group Therapy Note  Date/Time: 12/03/16, 1300  Type of Therapy/Topic:  Group Therapy:  Balance in Life  Participation Level:  Did not attend  Description of Group:    This group will address the concept of balance and how it feels and looks when one is unbalanced. Patients will be encouraged to process areas in their lives that are out of balance, and identify reasons for remaining unbalanced. Facilitators will guide patients utilizing problem- solving interventions to address and correct the stressor making their life unbalanced. Understanding and applying boundaries will be explored and addressed for obtaining  and maintaining a balanced life. Patients will be encouraged to explore ways to assertively make their unbalanced needs known to significant others in their lives, using other group members and facilitator for support and feedback.  Therapeutic Goals: 1. Patient will identify two or more emotions or situations they have that consume much of in their lives. 2. Patient will identify signs/triggers that life has become out of balance:  3. Patient will identify two ways to set boundaries in order to achieve balance in their lives:  4. Patient will demonstrate ability to communicate their needs through discussion and/or role plays  Summary of Patient Progress:          Therapeutic Modalities:   Cognitive Behavioral Therapy Solution-Focused Therapy Assertiveness Training  Daleen SquibbGreg Tangelia Sanson, LCSW

## 2016-12-03 NOTE — [facility] Group Notes (Signed)
[facility] Group Notes:  (Nursing/MHT/Case Management/Adjunct)  Date:  12/03/2016  Time:  6:59 PM  Type of Therapy:  Psychoeducational Skills  Participation Level:  Did Not Attend   Keinan Brouillet A Segundo Makela 12/03/2016, 6:59 PM 

## 2016-12-03 NOTE — Plan of Care (Signed)
Problem: Education: Goal: Knowledge of the prescribed therapeutic regimen will improve Outcome: Progressing Patient is aware of the medications she is taking and took without any hesitation.  Problem: Coping: Goal: Ability to verbalize feelings will improve Outcome: Progressing Patient verbalized her feelings with less delusions today.  Problem: Safety: Goal: Ability to remain free from injury will improve Outcome: Progressing Maintained safety in the unit.

## 2016-12-03 NOTE — Progress Notes (Addendum)
[facility] MD Progress Note  12/03/2016 2:20 PM Ruth Griffith  MRN:  [id number]  Subjective:   Ruth Griffith feels much better today. She slept well. She still hallucinates and is paranoid but no longer labile, intrusive or confused. She took medications this morning and reports no side effects.  The patient is too disorganized to sign consents.  Spoke with the mother. The patient was argumentative and unreasonable on the phone yesterday.  Treatment plan.  We will continue Zyprexa 15 mg twice daily.  Forced medication order is in place if the patient refuses oral medication.  Social/disposition.  The patient will be discharged to home with her mother.  She will follow-up with [facility].  Principal Problem: Bipolar I disorder, most recent episode mixed, severe with psychotic features (HCC) Diagnosis:   Patient Active Problem List   Diagnosis Date Noted  . Bipolar I disorder, most recent episode mixed, severe with psychotic features (HCC) [F31.64] 11/30/2016  . Cannabis use disorder, moderate, dependence (HCC) [F12.20] 11/30/2016   Total Time spent with patient: 20 minutes  Past Psychiatric History: None.  Past Medical History: History reviewed. No pertinent past medical history. History reviewed. No pertinent surgical history. Family History: History reviewed. No pertinent family history. Family Psychiatric  History: Sister with a history of psychotic break. Social History:  History  Alcohol Use No     History  Drug Use  . Types: Marijuana    Comment: Unknown related to AMS    Social History   Social History  . Marital status: Single    Spouse name: N/A  . Number of children: N/A  . Years of education: N/A   Social History Main Topics  . Smoking status: Unknown If Ever Smoked  . Smokeless tobacco: None  . Alcohol use No  . Drug use: Yes    Types: Marijuana     Comment: Unknown related to AMS  . Sexual activity: Not Asked     Comment: Unknown   Other Topics Concern  .  None   Social History Narrative  . None   Additional Social History:                         Sleep: Good  Appetite:  Fair  Current Medications: Current Facility-Administered Medications  Medication Dose Route Frequency Provider Last Rate Last Dose  . acetaminophen (TYLENOL) tablet 650 mg  650 mg Oral Q6H PRN Clapacs, John T, MD      . alum & mag hydroxide-simeth (MAALOX/MYLANTA) 200-200-20 MG/5ML suspension 30 mL  30 mL Oral Q4H PRN Clapacs, John T, MD      . diphenhydrAMINE (BENADRYL) capsule 50 mg  50 mg Oral Q6H PRN Eulan Heyward B, MD       Or  . diphenhydrAMINE (BENADRYL) injection 50 mg  50 mg Intramuscular Q6H PRN Jlon Betker B, MD   50 mg at 12/01/16 1954  . haloperidol (HALDOL) tablet 10 mg  10 mg Oral Q6H PRN Gaytha Raybourn B, MD       Or  . haloperidol lactate (HALDOL) injection 10 mg  10 mg Intramuscular Q6H PRN Sivan Cuello B, MD   10 mg at 12/01/16 1955  . LORazepam (ATIVAN) tablet 2 mg  2 mg Oral Q6H PRN Laddie Math B, MD       Or  . LORazepam (ATIVAN) injection 2 mg  2 mg Intramuscular Q6H PRN Natausha Jungwirth B, MD   2 mg at 12/01/16 1955  . magnesium  hydroxide (MILK OF MAGNESIA) suspension 30 mL  30 mL Oral Daily PRN Clapacs, John T, MD      . OLANZapine (ZYPREXA) injection 10 mg  10 mg Intramuscular BID WC Shalanda Brogden B, MD      . OLANZapine zydis (ZYPREXA) disintegrating tablet 15 mg  15 mg Oral BID AC Michelle Vanhise B, MD   15 mg at 12/03/16 0800  . temazepam (RESTORIL) capsule 15 mg  15 mg Oral QHS PRN Indiya Izquierdo B, MD        Lab Results: No results found for this or any previous visit (from the past 48 hour(s)).  Blood Alcohol level:  Lab Results  Component Value Date   ETH <10 11/30/2016    Metabolic Disorder Labs: Lab Results  Component Value Date   HGBA1C 5.0 12/01/2016   MPG 96.8 12/01/2016   No results found for: PROLACTIN Lab Results  Component Value Date   CHOL 151  12/01/2016   TRIG 71 12/01/2016   HDL 68 12/01/2016   CHOLHDL 2.2 12/01/2016   VLDL 14 12/01/2016   LDLCALC 69 12/01/2016    Physical Findings: AIMS: Facial and Oral Movements Muscles of Facial Expression: None, normal Lips and Perioral Area: None, normal Jaw: None, normal Tongue: None, normal,Extremity Movements Upper (arms, wrists, hands, fingers): None, normal Lower (legs, knees, ankles, toes): None, normal, Trunk Movements Neck, shoulders, hips: None, normal, Overall Severity Severity of abnormal movements (highest score from questions above): None, normal Incapacitation due to abnormal movements: None, normal Patient's awareness of abnormal movements (rate only patient's report): No Awareness, Dental Status Current problems with teeth and/or dentures?: No Does patient usually wear dentures?: No  CIWA:    COWS:     Musculoskeletal: Strength & Muscle Tone: within normal limits Gait & Station: normal Patient leans: N/A  Psychiatric Specialty Exam: Physical Exam  Nursing note and vitals reviewed. Psychiatric: Her speech is normal. Her affect is labile. She is hyperactive and actively hallucinating. Thought content is paranoid and delusional. Cognition and memory are normal. She expresses impulsivity.    Review of Systems  Neurological: Negative.   Psychiatric/Behavioral: Positive for hallucinations and substance abuse.  All other systems reviewed and are negative.   Blood pressure (!) 147/79, pulse 99, temperature 98.4 F (36.9 C), resp. rate 20, height 5\' 8"  (1.727 m), weight 60.8 kg (134 lb).Body mass index is 20.37 kg/m.  General Appearance: Casual  Eye Contact:  Good  Speech:  Clear and Coherent  Volume:  Normal  Mood:  Anxious  Affect:  Appropriate  Thought Process:  Goal Directed and Descriptions of Associations: Intact  Orientation:  Full (Time, Place, and Person)  Thought Content:  Delusions, Hallucinations: Auditory and Paranoid Ideation  Suicidal  Thoughts:  No  Homicidal Thoughts:  No  Memory:  Immediate;   Fair Recent;   Fair Remote;   Fair  Judgement:  Poor  Insight:  Lacking  Psychomotor Activity:  Normal  Concentration:  Concentration: Fair and Attention Span: Fair  Recall:  [employer]  Fund of Knowledge:  Fair  Language:  Fair  Akathisia:  No  Handed:  Right  AIMS (if indicated):     Assets:  Communication Skills Desire for Improvement Financial Resources/Insurance Housing Physical Health Resilience Social Support Transportation Vocational/Educational  ADL's:  Intact  Cognition:  WNL  Sleep:  Number of Hours: 7.75     Treatment Plan Summary: Daily contact with patient to assess and evaluate symptoms and progress in treatment and Medication management  Ms. Lamar Benes is a 21 year old female with no past psychiatric history admitted for psychotic break, most likely mixed bipolar episode. She is still disorganized, labile and psychotic. She accepted medication this morning.    # Agitation, resolved  -Haldol, Ativan and Benadryl PO and IM are available   # Psychosis, improving  - Continue Zyprexa Zydis 15 mg BID, injections of Zyprexa are available per [facility] - Head CT scan, pending, patient too psychotic   # Insomnia, improved  - slept better we start Restoril 15 mg nightly   # substance abuse  - positive for cannabis   # Metabolic syndrome monitoring  - Lipid panel, TSH and HgbA1C are normal  - EKG, QTc 428  - Pregnancy test is negative   #Clerical error -two different charts were created, the current one has errors in her sure name and DOB -this will not be resolved until discharge and merging of charts  # Disposition  - Discharge with family  - she will follow up with [facility]   Kristine Linea, MD 12/03/2016, 2:20 PM

## 2016-12-03 NOTE — Progress Notes (Addendum)
This writer woke patient up to take HS medication. Patient became disorganized in thoughts and was not able to take medication. Patient would just state wrong birth date and she just wanted to go back to bed to sleep. This Clinical research associatewriter educated patient on importance of medication compliance.

## 2016-12-03 NOTE — Plan of Care (Signed)
Problem: Education: Goal: Will be free of psychotic symptoms Outcome: Not Progressing Patient not progressing. And still showing signs of psychotic behaviors.

## 2016-12-03 NOTE — [facility] Group Notes (Signed)
Goals Group Date/Time: 12/03/2016 9:00 AM Type of Therapy and Topic: Group Therapy: Goals Group: SMART Goals   Participation Level: Moderate  Description of Group:    The purpose of a daily goals group is to assist and guide patients in setting recovery/wellness-related goals. The objective is to set goals as they relate to the crisis in which they were admitted. Patients will be using SMART goal modalities to set measurable goals. Characteristics of realistic goals will be discussed and patients will be assisted in setting and processing how one will reach their goal. Facilitator will also assist patients in applying interventions and coping skills learned in psycho-education groups to the SMART goal and process how one will achieve defined goal.   Therapeutic Goals:   -Patients will develop and document one goal related to or their crisis in which brought them into treatment.  -Patients will be guided by LCSW using SMART goal setting modality in how to set a measurable, attainable, realistic and time sensitive goal.  -Patients will process barriers in reaching goal.  -Patients will process interventions in how to overcome and successful in reaching goal.   Patient's Goal:To "continue to be a humanitarian" by talking to other patients.   Therapeutic Modalities:  Motivational Interviewing  Research officer, political partyCognitive Behavioral Therapy  Crisis Intervention Model  SMART goals setting   Ruth SquibbGreg Wilberto Griffith, [place]

## 2016-12-03 NOTE — Progress Notes (Addendum)
Recreation Therapy Notes  Date: 11.01.18  Time: 9:30 am  Location: Craft Room  Behavioral response: Hyperactive   Intervention Topic: Values  Discussion/Intervention: Group content today was focused on values. The group identified what values are and where they come from. Individuals expressed some values and how many they have. Patients described how they go about adding or removing values. The group described the importance of having values and how they go about using them in daily life. The group worked on worksheets that helped them define certain values and pick out important values.   Clinical Observations/Feedback:  Patient came to group and had to be redirected by staff several times to staff focus on task/topic at hand during group. Individual identified honesty as a very important value to her. She expressed that values come from family.   Dessa Ledee LRT/CTRS      Chamia Schmutz 12/03/2016 10:12 AM

## 2016-12-03 NOTE — Social Work (Signed)
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  Will graduate in December after she completes these classes.  Per mother, patient is unsure what she will do after graduation, may remain in [place] area.  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   Santa GeneraAnne Cunningham, LCSW Lead Clinical Social Worker Phone:  [phone]

## 2016-12-03 NOTE — Progress Notes (Signed)
Recreation Therapy Notes  Date: 11.01.18  Time: 3:00pm  Location: Outside  Behavioral response: Hyperactive  Group Type: Leisure  Participation level: Active  Communication: Patient was social with peers and staff. Individual had to be redirected by staff during group.  Comments: N/A  Keyari Kleeman LRT/CTRS        Aurelio Mccamy 12/03/2016 4:11 PM

## 2016-12-03 NOTE — Progress Notes (Signed)
Patient is pleasant and cooperative today.Denies SI,HI and AVH.Patient states "the only voices I am hearing in my room is the phone ringing'Appropriate with staff &peers.Compliant with medications.Appetite and energy level good.Attended groups.Support and encouragement given.

## 2016-12-04 MED ORDER — LITHIUM CARBONATE 300 MG PO CAPS
300.0000 mg | ORAL_CAPSULE | Freq: Three times a day (TID) | ORAL | Status: DC
  Administered 2016-12-04 – 2016-12-07 (×9): 300 mg via ORAL
  Filled 2016-12-04 (×9): qty 1

## 2016-12-04 NOTE — Progress Notes (Signed)
Patient up ad lib ambulating with steady gait. Appropriate interaction with peers noted. Attends group with active participation noted. Adequate po intake. Thought content seems to be more coherent. Will continue to monitor.

## 2016-12-04 NOTE — Plan of Care (Signed)
Problem: Activity: Goal: Sleeping patterns will improve Outcome: Progressing Patient slept for Estimated Hours of 7.15; Precautionary checks every 15 minutes for safety maintained, room free of safety hazards, patient sustains no injury or falls during this shift.    

## 2016-12-04 NOTE — Plan of Care (Signed)
Problem: Coping: Goal: Ability to cope will improve Outcome: Progressing Patient is coping well with treatment modalities and medication regimen  Problem: Safety: Goal: Ability to remain free from injury will improve Outcome: Progressing Patient is safe and secure free from harm to self and others

## 2016-12-04 NOTE — Plan of Care (Signed)
Problem: Education: Goal: Knowledge of [place] [facility] Education information/materials will improve Outcome: Progressing Patient verbalizes understanding the information provided to her. Goal: Mental status will improve Outcome: Progressing There is improvement in her mental condition  Problem: Activity: Goal: Sleeping patterns will improve Outcome: Progressing It was reported that patient slept 7 hours.

## 2016-12-04 NOTE — Progress Notes (Addendum)
Recreation Therapy Notes  Date: 11.02.18  Time: 9:30 am  Location: Craft Room  Behavioral response: Appropriate   Intervention Topic: Stress  Discussion/Intervention: Group content on today was focused stress. The group defined stress and way to cope with stress. Participants expressed how they know when they are stresses out. Individuals described the different ways they have to cope with stress. The group stated reasons why it is important to cope with stress. Patient explained what good stress is and some examples. The group participated in the intervention "Stress Management Jeopardy". Individuals were separated into two group and answered questions related to stress.  Clinical Observations/Feedback:  Patient came to group and was focused the task/topic at hand. She defined stress as pressure. Individual expressed that she copes with stress by taking control. She left group for unknown reasons but later returned. Patient needed no prompts from staff or redirection to stay on task.    Mana Morison LRT/CTRS     Zyion Doxtater 12/04/2016 11:47 AM

## 2016-12-04 NOTE — Progress Notes (Signed)
[facility] MD Progress Note  12/04/2016 10:24 AM Ruth Griffith  MRN:  [id number]  Subjective:   Ruth Griffith reports feeling better this morning. She slept well and took medications without problems. We discussed addition of the Lithium again but she refuses. She is still preoccupied with the error in her date of birth and sure name. She is asked about her birth date every time medication is administered. Last night, she was somewhat more agitated and disorganized.  Treatment plan. We will continue Zyprexa 15 mg BID for psychosis and mood stabilization. We will continue to offer Lithium as well.  Social/disposition. She will be discharged with her family and follow up with [facility].   Principal Problem: Bipolar I disorder, most recent episode mixed, severe with psychotic features (HCC) Diagnosis:   Patient Active Problem List   Diagnosis Date Noted  . Bipolar I disorder, most recent episode mixed, severe with psychotic features (HCC) [F31.64] 11/30/2016  . Cannabis use disorder, moderate, dependence (HCC) [F12.20] 11/30/2016   Total Time spent with patient: 20 minutes  Past Psychiatric History: None.  Past Medical History: History reviewed. No pertinent past medical history. History reviewed. No pertinent surgical history. Family History: History reviewed. No pertinent family history. Family Psychiatric  History: sister with a history of psychotic break. Social History:  History  Alcohol Use No     History  Drug Use  . Types: Marijuana    Comment: Unknown related to AMS    Social History   Social History  . Marital status: Single    Spouse name: N/A  . Number of children: N/A  . Years of education: N/A   Social History Main Topics  . Smoking status: Unknown If Ever Smoked  . Smokeless tobacco: None  . Alcohol use No  . Drug use: Yes    Types: Marijuana     Comment: Unknown related to AMS  . Sexual activity: Not Asked     Comment: Unknown   Other Topics Concern  . None    Social History Narrative  . None   Additional Social History:                         Sleep: Fair  Appetite:  Fair  Current Medications: Current Facility-Administered Medications  Medication Dose Route Frequency Provider Last Rate Last Dose  . acetaminophen (TYLENOL) tablet 650 mg  650 mg Oral Q6H PRN Clapacs, John T, MD      . alum & mag hydroxide-simeth (MAALOX/MYLANTA) 200-200-20 MG/5ML suspension 30 mL  30 mL Oral Q4H PRN Clapacs, John T, MD      . diphenhydrAMINE (BENADRYL) capsule 50 mg  50 mg Oral Q6H PRN Shahana Capes B, MD       Or  . diphenhydrAMINE (BENADRYL) injection 50 mg  50 mg Intramuscular Q6H PRN Jandy Brackens B, MD   50 mg at 12/01/16 1954  . haloperidol (HALDOL) tablet 10 mg  10 mg Oral Q6H PRN Almeter Westhoff B, MD       Or  . haloperidol lactate (HALDOL) injection 10 mg  10 mg Intramuscular Q6H PRN Kary Sugrue B, MD   10 mg at 12/01/16 1955  . LORazepam (ATIVAN) tablet 2 mg  2 mg Oral Q6H PRN Kanoe Wanner B, MD       Or  . LORazepam (ATIVAN) injection 2 mg  2 mg Intramuscular Q6H PRN Britt Petroni B, MD   2 mg at 12/01/16 1955  . magnesium hydroxide (  MILK OF MAGNESIA) suspension 30 mL  30 mL Oral Daily PRN Clapacs, John T, MD      . OLANZapine (ZYPREXA) injection 10 mg  10 mg Intramuscular BID WC Broedy Osbourne B, MD      . OLANZapine zydis (ZYPREXA) disintegrating tablet 15 mg  15 mg Oral BID AC Kiela Shisler B, MD   15 mg at 12/04/16 0926  . temazepam (RESTORIL) capsule 15 mg  15 mg Oral QHS PRN Briseyda Fehr B, MD   15 mg at 12/03/16 2138    Lab Results: No results found for this or any previous visit (from the past 48 hour(s)).  Blood Alcohol level:  Lab Results  Component Value Date   ETH <10 11/30/2016    Metabolic Disorder Labs: Lab Results  Component Value Date   HGBA1C 5.0 12/01/2016   MPG 96.8 12/01/2016   No results found for: PROLACTIN Lab Results  Component Value Date    CHOL 151 12/01/2016   TRIG 71 12/01/2016   HDL 68 12/01/2016   CHOLHDL 2.2 12/01/2016   VLDL 14 12/01/2016   LDLCALC 69 12/01/2016    Physical Findings: AIMS: Facial and Oral Movements Muscles of Facial Expression: None, normal Lips and Perioral Area: None, normal Jaw: None, normal Tongue: None, normal,Extremity Movements Upper (arms, wrists, hands, fingers): None, normal Lower (legs, knees, ankles, toes): None, normal, Trunk Movements Neck, shoulders, hips: None, normal, Overall Severity Severity of abnormal movements (highest score from questions above): None, normal Incapacitation due to abnormal movements: None, normal Patient's awareness of abnormal movements (rate only patient's report): No Awareness, Dental Status Current problems with teeth and/or dentures?: No Does patient usually wear dentures?: No  CIWA:    COWS:     Musculoskeletal: Strength & Muscle Tone: within normal limits Gait & Station: normal Patient leans: N/A  Psychiatric Specialty Exam: Physical Exam  Nursing note and vitals reviewed. Psychiatric: Her affect is labile. Her speech is rapid and/or pressured. She is hyperactive and actively hallucinating. Thought content is paranoid and delusional. Cognition and memory are normal. She expresses impulsivity.    Review of Systems  Neurological: Negative.   Psychiatric/Behavioral: Positive for hallucinations and substance abuse.  All other systems reviewed and are negative.   Blood pressure 128/85, pulse 78, temperature 98.1 F (36.7 C), temperature source Oral, resp. rate 18, height 5\' 8"  (1.727 m), weight 60.8 kg (134 lb), SpO2 100 %.Body mass index is 20.37 kg/m.  General Appearance: Fairly Groomed  Eye Contact:  Good  Speech:  Clear and Coherent  Volume:  Normal  Mood:  Dysphoric  Affect:  Congruent  Thought Process:  Disorganized and Descriptions of Associations: Tangential  Orientation:  Full (Time, Place, and Person)  Thought Content:   Delusions, Hallucinations: Auditory and Paranoid Ideation  Suicidal Thoughts:  No  Homicidal Thoughts:  No  Memory:  Immediate;   Fair Recent;   Fair Remote;   Fair  Judgement:  Poor  Insight:  Lacking  Psychomotor Activity:  Normal  Concentration:  Concentration: Fair and Attention Span: Fair  Recall:  [employer] of Knowledge:  Fair  Language:  Fair  Akathisia:  No  Handed:  Right  AIMS (if indicated):     Assets:  Communication Skills Desire for Improvement Housing Physical Health Resilience Social Support Transportation Vocational/Educational  ADL's:  Intact  Cognition:  WNL  Sleep:  Number of Hours: 7.15     Treatment Plan Summary: Daily contact with patient to assess and evaluate symptoms and progress  in treatment and Medication management   Ms. Lamar Beneslverado is a 21 year old female with no past psychiatric history admitted for psychotic break, most likely mixed bipolar episode. She is still disorganized, labile and psychotic. She accepted medication this morning.    # Agitation, resolved  -Haldol, Ativan and Benadryl PO and IM are available   # Psychosis, improving  - Continue Zyprexa Zydis 15 mg BID, injections of Zyprexa are available per [facility] -Start Lithium 300 mg TID - Head CT scan, pending, patient too psychotic   # Insomnia, improved  - slept better we start Restoril 15 mg nightly   # substance abuse  - positive for cannabis   # Metabolic syndrome monitoring  - Lipid panel, TSH and HgbA1C are normal  - EKG, QTc 428  - Pregnancy test is negative   #Clerical error -two different charts were created, the current one has errors in her sure name and DOB -this will not be resolved until discharge and merging of charts  # Disposition  - Discharge with family  - she will follow up with [facility]   Kristine LineaJolanta Agam Davenport, MD 12/04/2016, 10:24 AM

## 2016-12-04 NOTE — Progress Notes (Signed)
Patient declared, during medication administration that "I was born [birth year], I am 21 years old, my name is Ruth Griffith and not Alverado; call my mother to confirm .Marland Kitchen.Marland Kitchen."

## 2016-12-04 NOTE — Progress Notes (Signed)
Patient ID: Lenon OmsJenna Margie Alverado, female   DOB: 02/24/1993, 21 y.o.   MRN: [id number] Observed around the phone area awaiting a call; pleasant on approach, grossly disorganized in thoughts, speech rapid, pressured but soft with loose association (tangential type); blaming others; unremarkable appearance; her affect is bright, engaging, slightly anxious. A&Ox3, no pain except for the "injection sites"; denied AV/H, denied SI/HI. Requested and obtain wash cloths and towels, showered; Restoril 15 mg capsule given for insomnia.

## 2016-12-04 NOTE — [facility] Group Notes (Signed)
[facility] Group Notes:  (Nursing/MHT/Case Management/Adjunct)  Date:  12/04/2016  Time:  4:37 AM  Type of Therapy:  Psychoeducational Skills  Participation Level:  Active  Participation Quality:  Appropriate, Attentive and Sharing  Affect:  Appropriate  Cognitive:  Appropriate  Insight:  Appropriate  Engagement in Group:  Engaged  Modes of Intervention:  Discussion, Socialization and Support  Summary of Progress/Problems:  Chancy MilroyLaquanda Y Mohammed Mcandrew 12/04/2016, 4:37 AM

## 2016-12-04 NOTE — Progress Notes (Signed)
Recreation Therapy Notes  Date: 11.02.18  Time: 3:00pm  Location: Craft room  Behavioral response: Did not attend  Group Type: Art/Craft  Participation level: Did not attend  Communication: Patient did not attend group.  Comments: N/A  Esly Selvage LRT/CTRS        Makaila Windle 12/04/2016 4:13 PM

## 2016-12-05 NOTE — Progress Notes (Signed)
Data: Patient is alert and oriented, reports no AVH, and has denied SI/HI at this time. Patient is anxious in mood and affect. Patient presents as disorganized in thought. Patient has no physical complaints and a pain rating of 0/10. Patient reports "good" sleep for 8 hours,  appetite is "good." Patient rates depression "0/10" , Feelings of hopelessness "0/10" and Anxiety "0/10" Patients goal for today is "to talk to everyone and make more friends."  Patient requested multiple time for more information on discharge plan.  Action:  Q x 15 minute observation checks were completed for safety. Patient was provided with education on medications. Patient was offered support and encouragement. Patient was given scheduled medications. Patient  was encourage to attend groups, participate in unit activities and continue with plan of care.    Response: Patient is medication compliant. Patient has no complaints at this time. Patient is receptive to treatment and safety maintained on unit.

## 2016-12-05 NOTE — [facility] Group Notes (Signed)
[facility] Group Notes:  (Nursing/MHT/Case Management/Adjunct)  Date:  12/05/2016  Time:  11:01 PM  Type of Therapy:  Psychoeducational Skills  Participation Level:  Active  Participation Quality:  Appropriate and Sharing  Affect:  Appropriate  Cognitive:  Appropriate  Insight:  Appropriate and Good  Engagement in Group:  Engaged  Modes of Intervention:  Discussion, Socialization and Support  Summary of Progress/Problems:  Ruth Griffith 12/05/2016, 11:01 PM

## 2016-12-05 NOTE — Progress Notes (Signed)
[facility] MD Progress Note  12/05/2016 2:05 PM Ruth Griffith  MRN:  [id number]  Subjective:  Ruth Griffith seem better today. She is well groomed, her hair in aneat bun, doing loundry. She is only slightly delusional. She had an anxiety attack this morning but was able to deal with it drawing a picture. Her father and a friend visited last time. No side effects from medications.   Spoke with the mother who would prefer the patient to be off Lihium. She does not believe that head Ct scan is necessary. She is asking for a letter to the college stating that the patient unlikely to be able to return to school this semester.  Treatment plan. We will continue Zyprexa and Lithium for psychosis and mood stabilization.  Social.disposition. She will be discharged with family. Follow up with [facility].   Principal Problem: Bipolar I disorder, most recent episode mixed, severe with psychotic features (HCC) Diagnosis:   Patient Active Problem List   Diagnosis Date Noted  . Bipolar I disorder, most recent episode mixed, severe with psychotic features (HCC) [F31.64] 11/30/2016  . Cannabis use disorder, moderate, dependence (HCC) [F12.20] 11/30/2016   Total Time spent with patient: 20 minutes  Past Psychiatric History: none.  Past Medical History: History reviewed. No pertinent past medical history. History reviewed. No pertinent surgical history. Family History: History reviewed. No pertinent family history. Family Psychiatric  History: sister with mental illness. Social History:  History  Alcohol Use No     History  Drug Use  . Types: Marijuana    Comment: Unknown related to AMS    Social History   Social History  . Marital status: Single    Spouse name: N/A  . Number of children: N/A  . Years of education: N/A   Social History Main Topics  . Smoking status: Unknown If Ever Smoked  . Smokeless tobacco: None  . Alcohol use No  . Drug use: Yes    Types: Marijuana     Comment: Unknown  related to AMS  . Sexual activity: Not Asked     Comment: Unknown   Other Topics Concern  . None   Social History Narrative  . None   Additional Social History:                         Sleep: Fair  Appetite:  Fair  Current Medications: Current Facility-Administered Medications  Medication Dose Route Frequency Provider Last Rate Last Dose  . acetaminophen (TYLENOL) tablet 650 mg  650 mg Oral Q6H PRN Clapacs, Jackquline Denmark, MD   650 mg at 12/05/16 0913  . alum & mag hydroxide-simeth (MAALOX/MYLANTA) 200-200-20 MG/5ML suspension 30 mL  30 mL Oral Q4H PRN Clapacs, John T, MD      . diphenhydrAMINE (BENADRYL) capsule 50 mg  50 mg Oral Q6H PRN Pucilowska, Jolanta B, MD       Or  . diphenhydrAMINE (BENADRYL) injection 50 mg  50 mg Intramuscular Q6H PRN Pucilowska, Jolanta B, MD   50 mg at 12/01/16 1954  . haloperidol (HALDOL) tablet 10 mg  10 mg Oral Q6H PRN Pucilowska, Jolanta B, MD       Or  . haloperidol lactate (HALDOL) injection 10 mg  10 mg Intramuscular Q6H PRN Pucilowska, Jolanta B, MD   10 mg at 12/01/16 1955  . lithium carbonate capsule 300 mg  300 mg Oral TID WC Pucilowska, Jolanta B, MD   300 mg at 12/05/16 1112  .  LORazepam (ATIVAN) tablet 2 mg  2 mg Oral Q6H PRN Pucilowska, Jolanta B, MD       Or  . LORazepam (ATIVAN) injection 2 mg  2 mg Intramuscular Q6H PRN Pucilowska, Jolanta B, MD   2 mg at 12/01/16 1955  . magnesium hydroxide (MILK OF MAGNESIA) suspension 30 mL  30 mL Oral Daily PRN Clapacs, John T, MD      . OLANZapine (ZYPREXA) injection 10 mg  10 mg Intramuscular BID WC Pucilowska, Jolanta B, MD      . OLANZapine zydis (ZYPREXA) disintegrating tablet 15 mg  15 mg Oral BID AC Pucilowska, Jolanta B, MD   15 mg at 12/05/16 0745  . temazepam (RESTORIL) capsule 15 mg  15 mg Oral QHS PRN Pucilowska, Jolanta B, MD   15 mg at 12/03/16 2138    Lab Results: No results found for this or any previous visit (from the past 48 hour(s)).  Blood Alcohol level:  Lab Results   Component Value Date   ETH <10 11/30/2016    Metabolic Disorder Labs: Lab Results  Component Value Date   HGBA1C 5.0 12/01/2016   MPG 96.8 12/01/2016   No results found for: PROLACTIN Lab Results  Component Value Date   CHOL 151 12/01/2016   TRIG 71 12/01/2016   HDL 68 12/01/2016   CHOLHDL 2.2 12/01/2016   VLDL 14 12/01/2016   LDLCALC 69 12/01/2016    Physical Findings: AIMS: Facial and Oral Movements Muscles of Facial Expression: None, normal Lips and Perioral Area: None, normal Jaw: None, normal Tongue: None, normal,Extremity Movements Upper (arms, wrists, hands, fingers): None, normal Lower (legs, knees, ankles, toes): None, normal, Trunk Movements Neck, shoulders, hips: None, normal, Overall Severity Severity of abnormal movements (highest score from questions above): None, normal Incapacitation due to abnormal movements: None, normal Patient's awareness of abnormal movements (rate only patient's report): No Awareness, Dental Status Current problems with teeth and/or dentures?: No Does patient usually wear dentures?: No  CIWA:    COWS:     Musculoskeletal: Strength & Muscle Tone: within normal limits Gait & Station: normal Patient leans: N/A  Psychiatric Specialty Exam: Physical Exam  Nursing note and vitals reviewed. Psychiatric: She has a normal mood and affect. Her speech is normal and behavior is normal. Thought content is delusional. Cognition and memory are normal. She expresses impulsivity.    Review of Systems  Neurological: Negative.   Psychiatric/Behavioral: Positive for hallucinations and substance abuse.  All other systems reviewed and are negative.   Blood pressure 128/83, pulse 83, temperature 98.1 F (36.7 C), temperature source Oral, resp. rate 18, height 5\' 8"  (1.727 m), weight 60.8 kg (134 lb), SpO2 100 %.Body mass index is 20.37 kg/m.  General Appearance: Casual  Eye Contact:  Good  Speech:  Clear and Coherent  Volume:  Normal   Mood:  Anxious  Affect:  Appropriate  Thought Process:  Disorganized and Descriptions of Associations: Loose  Orientation:  Full (Time, Place, and Person)  Thought Content:  Delusions  Suicidal Thoughts:  No  Homicidal Thoughts:  No  Memory:  Immediate;   Fair Recent;   Fair Remote;   Fair  Judgement:  Poor  Insight:  Shallow  Psychomotor Activity:  Normal  Concentration:  Concentration: Fair and Attention Span: Fair  Recall:  [employer] of Knowledge:  Fair  Language:  Fair  Akathisia:  No  Handed:  Right  AIMS (if indicated):     Assets:  Communication Skills Desire for Improvement  Housing Physical Health Resilience Social Support Transportation Vocational/Educational  ADL's:  Intact  Cognition:  WNL  Sleep:  Number of Hours: 8     Treatment Plan Summary: Daily contact with patient to assess and evaluate symptoms and progress in treatment and Medication management   Ruth Griffith is a 21 year old female with no past psychiatric history admitted for psychotic break, most likely mixed bipolar episode. She is still disorganized, labile and psychotic. She accepted medication this morning.   # Agitation, resolved -Haldol, Ativan and Benadryl PO and IM are available   # Psychosis, improving - Continue Zyprexa Zydis 15 mg BID, injections of Zyprexa are available per [facility] -Continue Lithium 300 mg TID, level on Monday - Head CT scan, pending, patient is still too psychotic   # Insomnia, improved  - slept better we start Restoril 15 mg nightly   # substance abuse  - positive for cannabis   # Metabolic syndrome monitoring  - Lipid panel, TSH and HgbA1C are normal  - EKG, QTc 428  - Pregnancy test is negative   #Clerical error -two different charts were created, the current one has errors in her sure name and DOB -this will not be resolved until discharge and merging of charts  # Disposition  - Discharge with family  - she will follow up with  [facility]  Kristine LineaJolanta Pucilowska, MD 12/05/2016, 2:05 PM

## 2016-12-05 NOTE — [facility] Group Notes (Signed)
[facility] Group Notes:  (Nursing/MHT/Case Management/Adjunct)  Date:  12/05/2016  Time:  4:57 AM  Type of Therapy:  Psychoeducational Skills  Participation Level:  Active  Participation Quality:  Appropriate, Attentive and Sharing  Affect:  Appropriate  Cognitive:  Appropriate  Insight:  Appropriate and Good  Engagement in Group:  Engaged  Modes of Intervention:  Discussion, Socialization and Support  Summary of Progress/Problems:  Chancy MilroyLaquanda Y Tryston Gilliam 12/05/2016, 4:57 AM

## 2016-12-05 NOTE — Plan of Care (Signed)
Problem: Education: Goal: Knowledge of the prescribed therapeutic regimen will improve Outcome: Progressing Patient reported understanding of medication zyprexa PO vs zyprexa IM  Problem: Education: Goal: Knowledge of [place] [facility] Education information/materials will improve Outcome: Progressing Patient verbalized understanding

## 2016-12-06 NOTE — Progress Notes (Signed)
[facility] MD Progress Note  12/06/2016 5:53 PM Ruth Griffith  MRN:  [id number]  Subjective:   Ruth Griffith reports further improvement ut is still disorganized in her thinking and paranoid. She is unable to participate in discharge planning. She is social, participates in groups, takes medications, reports no side effects.  Treatment paln. Continue Zyprexa 15 mg BID and Lithium 300 mg TID. Level tomorrow. Unfortunately, her mother does not approve of Lithium and did not accept diagnosis of bipolar.  Social/Disposition. She will be discharged with family. Follow up with [facility].  Principal Problem: Bipolar I disorder, most recent episode mixed, severe with psychotic features (HCC) Diagnosis:   Patient Active Problem List   Diagnosis Date Noted  . Bipolar I disorder, most recent episode mixed, severe with psychotic features (HCC) [F31.64] 11/30/2016  . Cannabis use disorder, moderate, dependence (HCC) [F12.20] 11/30/2016   Total Time spent with patient: 20 minutes  Past Psychiatric History: None.  Past Medical History: History reviewed. No pertinent past medical history. History reviewed. No pertinent surgical history. Family History: History reviewed. No pertinent family history. Family Psychiatric  History: Sister with a history of psychotic break. Social History:  Social History   Substance and Sexual Activity  Alcohol Use No     Social History   Substance and Sexual Activity  Drug Use Yes  . Types: Marijuana   Comment: Unknown related to AMS    Social History   Socioeconomic History  . Marital status: Single    Spouse name: None  . Number of children: None  . Years of education: None  . Highest education level: None  Social Needs  . Financial resource strain: None  . Food insecurity - worry: None  . Food insecurity - inability: None  . Transportation needs - medical: None  . Transportation needs - non-medical: None  Occupational History  . None  Tobacco Use  .  Smoking status: Unknown If Ever Smoked  Substance and Sexual Activity  . Alcohol use: No  . Drug use: Yes    Types: Marijuana    Comment: Unknown related to AMS  . Sexual activity: None    Comment: Unknown  Other Topics Concern  . None  Social History Narrative  . None   Additional Social History:                         Sleep: Fair  Appetite:  Fair  Current Medications: Current Facility-Administered Medications  Medication Dose Route Frequency Provider Last Rate Last Dose  . acetaminophen (TYLENOL) tablet 650 mg  650 mg Oral Q6H PRN Clapacs, Jackquline Denmark, MD   650 mg at 12/06/16 0359  . alum & mag hydroxide-simeth (MAALOX/MYLANTA) 200-200-20 MG/5ML suspension 30 mL  30 mL Oral Q4H PRN Clapacs, John T, MD      . diphenhydrAMINE (BENADRYL) capsule 50 mg  50 mg Oral Q6H PRN Mandi Mattioli B, MD       Or  . diphenhydrAMINE (BENADRYL) injection 50 mg  50 mg Intramuscular Q6H PRN Azriel Dancy B, MD   50 mg at 12/01/16 1954  . haloperidol (HALDOL) tablet 10 mg  10 mg Oral Q6H PRN Azir Muzyka B, MD       Or  . haloperidol lactate (HALDOL) injection 10 mg  10 mg Intramuscular Q6H PRN Orest Dygert B, MD   10 mg at 12/01/16 1955  . lithium carbonate capsule 300 mg  300 mg Oral TID WC Jimma Ortman B, MD  300 mg at 12/06/16 1550  . LORazepam (ATIVAN) tablet 2 mg  2 mg Oral Q6H PRN Jamiracle Avants B, MD       Or  . LORazepam (ATIVAN) injection 2 mg  2 mg Intramuscular Q6H PRN Juletta Berhe B, MD   2 mg at 12/01/16 1955  . magnesium hydroxide (MILK OF MAGNESIA) suspension 30 mL  30 mL Oral Daily PRN Clapacs, Jackquline DenmarkJohn T, MD   30 mL at 12/06/16 0647  . OLANZapine (ZYPREXA) injection 10 mg  10 mg Intramuscular BID WC Shemicka Cohrs B, MD      . OLANZapine zydis (ZYPREXA) disintegrating tablet 15 mg  15 mg Oral BID AC Zakhai Meisinger B, MD   15 mg at 12/06/16 1550  . temazepam (RESTORIL) capsule 15 mg  15 mg Oral QHS PRN Zakeya Junker B,  MD   15 mg at 12/03/16 2138    Lab Results: No results found for this or any previous visit (from the past 48 hour(s)).  Blood Alcohol level:  Lab Results  Component Value Date   ETH <10 11/30/2016    Metabolic Disorder Labs: Lab Results  Component Value Date   HGBA1C 5.0 12/01/2016   MPG 96.8 12/01/2016   No results found for: PROLACTIN Lab Results  Component Value Date   CHOL 151 12/01/2016   TRIG 71 12/01/2016   HDL 68 12/01/2016   CHOLHDL 2.2 12/01/2016   VLDL 14 12/01/2016   LDLCALC 69 12/01/2016    Physical Findings: AIMS: Facial and Oral Movements Muscles of Facial Expression: None, normal Lips and Perioral Area: None, normal Jaw: None, normal Tongue: None, normal,Extremity Movements Upper (arms, wrists, hands, fingers): None, normal Lower (legs, knees, ankles, toes): None, normal, Trunk Movements Neck, shoulders, hips: None, normal, Overall Severity Severity of abnormal movements (highest score from questions above): None, normal Incapacitation due to abnormal movements: None, normal Patient's awareness of abnormal movements (rate only patient's report): No Awareness, Dental Status Current problems with teeth and/or dentures?: No Does patient usually wear dentures?: No  CIWA:    COWS:     Musculoskeletal: Strength & Muscle Tone: within normal limits Gait & Station: normal Patient leans: N/A  Psychiatric Specialty Exam: Physical Exam  Nursing note and vitals reviewed. Psychiatric: She has a normal mood and affect. Her speech is normal and behavior is normal. Judgment normal. Thought content is delusional. Cognition and memory are normal.    Review of Systems  Neurological: Negative.   Psychiatric/Behavioral: Negative.   All other systems reviewed and are negative.   Blood pressure 133/81, pulse 95, temperature 98 F (36.7 C), temperature source Oral, resp. rate 16, height 5\' 8"  (1.727 m), weight 60.8 kg (134 lb), SpO2 100 %.Body mass index is 20.37  kg/m.  General Appearance: Casual  Eye Contact:  Good  Speech:  Clear and Coherent  Volume:  Normal  Mood:  Anxious  Affect:  Blunt  Thought Process:  Disorganized and Descriptions of Associations: Tangential  Orientation:  Full (Time, Place, and Person)  Thought Content:  Delusions  Suicidal Thoughts:  No  Homicidal Thoughts:  No  Memory:  Immediate;   Fair Recent;   Fair Remote;   Fair  Judgement:  Poor  Insight:  Lacking  Psychomotor Activity:  Normal  Concentration:  Concentration: Fair and Attention Span: Fair  Recall:  F[employer]ervFair  Fund of Knowledge:  Fair  Language:  Fair  Akathisia:  No  Handed:  Right  AIMS (if indicated):     Assets:  Communication Skills Desire for Improvement Housing Physical Health Resilience Social Support Talents/Skills  ADL's:  Intact  Cognition:  WNL  Sleep:  Number of Hours: 7.3     Treatment Plan Summary: Daily contact with patient to assess and evaluate symptoms and progress in treatment and Medication management   Ruth Griffith is a 21 year old female with no past psychiatric history admitted for psychotic break, most likely mixed bipolar episode. She is still disorganized, labile and psychotic. She accepted medication this morning.   # Agitation, resolved -Haldol, Ativan and Benadryl PO and IM are available   # Psychosis, improving - Continue Zyprexa zydis 15 mg BID - Continue Lithium 300 mg TID - Lithium level in AM - Head CT scan, pending. The mother opposes  # Insomnia, improved  - slept better we start Restoril 15 mg nightly   # substance abuse  - positive for cannabis   # Metabolic syndrome monitoring  - Lipid panel, TSH and HgbA1C are normal  - EKG, QTc 428  - Pregnancy test is negative   # Disposition  - Discharge with family  - she will follow up with [facility]     Kristine Linea, MD 12/06/2016, 5:53 PM

## 2016-12-06 NOTE — Progress Notes (Addendum)
Patient attends groups and participates. Patient interacts with staff and other patients appropriately. Patient is calm and is compliant with medications.Patient Denies HI, SI and hallucinations. Patient appetite remains good. Patient has no complaints of pain at this moment. Patient will most likely be discharged this week.

## 2016-12-06 NOTE — BHH Group Notes (Signed)
LCSW Group Therapy Note  12/06/2016 1:15pm  Type of Therapy/Topic:  Group Therapy:  Balance in Life  Participation Level:  Active  Description of Group:    This group will address the concept of balance and how it feels and looks when one is unbalanced. Patients will be encouraged to process areas in their lives that are out of balance and identify reasons for remaining unbalanced. Facilitators will guide patients in utilizing problem-solving interventions to address and correct the stressor making their life unbalanced. Understanding and applying boundaries will be explored and addressed for obtaining and maintaining a balanced life. Patients will be encouraged to explore ways to assertively make their unbalanced needs known to significant others in their lives, using other group members and facilitator for support and feedback.  Therapeutic Goals: 1. Patient will identify two or more emotions or situations they have that consume much of in their lives. 2. Patient will identify signs/triggers that life has become out of balance:  3. Patient will identify two ways to set boundaries in order to achieve balance in their lives:  4. Patient will demonstrate ability to communicate their needs through discussion and/or role plays  Summary of Patient Progress: Pt was present for the duration of the group. Pt states that she feels like she is in between balanced and unbalanced currently. Pt reports that she has manic episodes once a month and will spend excessive amounts of money during each episode. Pt reports that she always feels good about it in the moment but once she is no longer in a manic state, she usually regrets all her decisions. Pt's affect was appropriate and she engaged positively with her peers in the group.   Therapeutic Modalities:   Cognitive Behavioral Therapy Solution-Focused Therapy Assertiveness Training  Jonathon JordanLynn B Allard Lightsey, MSW, LCSWA 12/06/2016 2:24 PM

## 2016-12-06 NOTE — Plan of Care (Signed)
Problem: Able to verbalize feelings will improve  Goal: Patient will be able to communicate thoughts of feelings to staff.  Outcome: Patient ability to communicate with staff has improved. Patient shares feelings and today patient denies HI, SI, and Hallucinations.

## 2016-12-06 NOTE — BH Assessment (Signed)
Pt. States having a good day today no problems.  Pt. Stated to this nurse that she was tired and just wanted to get some sleep.  Pt. Calm and oriented.  Pt. Denies SI and HI and no A/VH.  Pt. Has no complaints at this time, but feels free to express herself to this nurse if needed.

## 2016-12-07 LAB — LITHIUM LEVEL: LITHIUM LVL: 0.42 mmol/L — AB (ref 0.60–1.20)

## 2016-12-07 MED ORDER — TRAZODONE HCL 100 MG PO TABS: 100.0000 mg | ORAL_TABLET | Freq: Every day | ORAL | 1 refills | Status: DC

## 2016-12-07 MED ORDER — TEMAZEPAM 7.5 MG PO CAPS
7.5000 mg | ORAL_CAPSULE | Freq: Every evening | ORAL | Status: DC | PRN
Start: 2016-12-07 — End: 2016-12-07

## 2016-12-07 MED ORDER — OLANZAPINE 15 MG PO TABS: 15.0000 mg | ORAL_TABLET | Freq: Two times a day (BID) | ORAL | 1 refills | Status: DC

## 2016-12-07 MED ORDER — OLANZAPINE 5 MG PO TABS
15.0000 mg | ORAL_TABLET | Freq: Two times a day (BID) | ORAL | Status: DC
  Administered 2016-12-07 – 2016-12-08 (×2): 15 mg via ORAL
  Filled 2016-12-07 (×2): qty 1

## 2016-12-07 MED ORDER — TRAZODONE HCL 100 MG PO TABS
100.0000 mg | ORAL_TABLET | Freq: Every day | ORAL | Status: DC
  Administered 2016-12-07: 100 mg via ORAL
  Filled 2016-12-07: qty 1

## 2016-12-07 MED ORDER — LITHIUM CARBONATE 300 MG PO CAPS
600.0000 mg | ORAL_CAPSULE | Freq: Two times a day (BID) | ORAL | Status: DC
  Administered 2016-12-07 – 2016-12-08 (×2): 600 mg via ORAL
  Filled 2016-12-07 (×2): qty 2

## 2016-12-07 MED ORDER — LITHIUM CARBONATE 600 MG PO CAPS: 600.0000 mg | ORAL_CAPSULE | Freq: Two times a day (BID) | ORAL | 1 refills | Status: DC

## 2016-12-07 NOTE — BH Assessment (Signed)
This nurse talked to patient while in patients room.  Pt. States she was in bed because she was feeling tired.  Pt. States she participated in all groups today.  Pt. States having a good day and wanting to go home tomorrow.  Pt. States she talked to doctor and he felt good about sending her home to live with mother.  Pt. States she will be living with mother and continuing college at home(on line studies in art).

## 2016-12-07 NOTE — Tx Team (Signed)
Interdisciplinary Treatment and Diagnostic Plan Update  12/07/2016 Time of Session: 10:30am Ruth Griffith MRN: [id number]  Principal Diagnosis: Bipolar I disorder, most recent episode mixed, severe with psychotic features (HCC)  Secondary Diagnoses: Principal Problem:   Bipolar I disorder, most recent episode mixed, severe with psychotic features (HCC) Active Problems:   Cannabis use disorder, moderate, dependence (HCC)   Current Medications:  Current Facility-Administered Medications  Medication Dose Route Frequency Provider Last Rate Last Dose  . acetaminophen (TYLENOL) tablet 650 mg  650 mg Oral Q6H PRN Clapacs, Jackquline DenmarkJohn T, MD   650 mg at 12/06/16 0359  . alum & mag hydroxide-simeth (MAALOX/MYLANTA) 200-200-20 MG/5ML suspension 30 mL  30 mL Oral Q4H PRN Clapacs, John T, MD      . lithium carbonate capsule 600 mg  600 mg Oral BID WC Pucilowska, Jolanta B, MD      . magnesium hydroxide (MILK OF MAGNESIA) suspension 30 mL  30 mL Oral Daily PRN Clapacs, Jackquline DenmarkJohn T, MD   30 mL at 12/06/16 0647  . OLANZapine zydis (ZYPREXA) disintegrating tablet 15 mg  15 mg Oral BID AC Pucilowska, Jolanta B, MD   15 mg at 12/07/16 0829  . temazepam (RESTORIL) capsule 15 mg  15 mg Oral QHS PRN Pucilowska, Jolanta B, MD   15 mg at 12/03/16 2138   PTA Medications: No medications prior to admission.    Patient Stressors: Substance abuse  Patient Strengths: Ability for insight Average or above average intelligence Capable of independent living Communication skills Physical Health Supportive family/friends Other: In college   Treatment Modalities: Medication Management, Group therapy, Case management,  1 to 1 session with clinician, Psychoeducation, Recreational therapy.   Physician Treatment Plan for Primary Diagnosis: Bipolar I disorder, most recent episode mixed, severe with psychotic features (HCC) Long Term Goal(s): Improvement in symptoms so as ready for discharge Improvement in symptoms so  as ready for discharge   Short Term Goals: Ability to identify changes in lifestyle to reduce recurrence of condition will improve Ability to verbalize feelings will improve Ability to disclose and discuss suicidal ideas Ability to demonstrate self-control will improve Ability to identify and develop effective coping behaviors will improve Compliance with prescribed medications will improve Ability to identify triggers associated with substance abuse/mental health issues will improve Ability to identify changes in lifestyle to reduce recurrence of condition will improve Ability to demonstrate self-control will improve Ability to identify triggers associated with substance abuse/mental health issues will improve  Medication Management: Evaluate patient's response, side effects, and tolerance of medication regimen.  Therapeutic Interventions: 1 to 1 sessions, Unit Group sessions and Medication administration.  Evaluation of Outcomes: Progressing  Physician Treatment Plan for Secondary Diagnosis: Principal Problem:   Bipolar I disorder, most recent episode mixed, severe with psychotic features (HCC) Active Problems:   Cannabis use disorder, moderate, dependence (HCC)  Long Term Goal(s): Improvement in symptoms so as ready for discharge Improvement in symptoms so as ready for discharge   Short Term Goals: Ability to identify changes in lifestyle to reduce recurrence of condition will improve Ability to verbalize feelings will improve Ability to disclose and discuss suicidal ideas Ability to demonstrate self-control will improve Ability to identify and develop effective coping behaviors will improve Compliance with prescribed medications will improve Ability to identify triggers associated with substance abuse/mental health issues will improve Ability to identify changes in lifestyle to reduce recurrence of condition will improve Ability to demonstrate self-control will improve Ability  to identify triggers associated with substance abuse/mental  health issues will improve     Medication Management: Evaluate patient's response, side effects, and tolerance of medication regimen.  Therapeutic Interventions: 1 to 1 sessions, Unit Group sessions and Medication administration.  Evaluation of Outcomes: Progressing   RN Treatment Plan for Primary Diagnosis: Bipolar I disorder, most recent episode mixed, severe with psychotic features (HCC) Long Term Goal(s): Knowledge of disease and therapeutic regimen to maintain health will improve  Short Term Goals: Ability to demonstrate self-control, Ability to identify and develop effective coping behaviors will improve and Compliance with prescribed medications will improve  Medication Management: RN will administer medications as ordered by provider, will assess and evaluate patient's response and provide education to patient for prescribed medication. RN will report any adverse and/or side effects to prescribing provider.  Therapeutic Interventions: 1 on 1 counseling sessions, Psychoeducation, Medication administration, Evaluate responses to treatment, Monitor vital signs and CBGs as ordered, Perform/monitor CIWA, COWS, AIMS and Fall Risk screenings as ordered, Perform wound care treatments as ordered.  Evaluation of Outcomes: Progressing   LCSW Treatment Plan for Primary Diagnosis: Bipolar I disorder, most recent episode mixed, severe with psychotic features (HCC) Long Term Goal(s): Safe transition to appropriate next level of care at discharge, Engage patient in therapeutic group addressing interpersonal concerns.  Short Term Goals: Engage patient in aftercare planning with referrals and resources, Facilitate acceptance of mental health diagnosis and concerns and Increase skills for wellness and recovery  Therapeutic Interventions: Assess for all discharge needs, 1 to 1 time with Social worker, Explore available resources and support  systems, Assess for adequacy in community support network, Educate family and significant other(s) on suicide prevention, Complete Psychosocial Assessment, Interpersonal group therapy.  Evaluation of Outcomes: Progressing   Progress in Treatment: Attending groups: Yes. Participating in groups: Yes. Taking medication as prescribed: No. and As evidenced by:  refusing medication frequently.  Requiring forced medications. Toleration medication: Yes. Family/Significant other contact made: No, will contact:   Mother Patient understands diagnosis: Yes. Limited insight Discussing patient identified problems/goals with staff: Yes. Medical problems stabilized or resolved: Yes. Denies suicidal/homicidal ideation: Yes. Issues/concerns per patient self-inventory: No. Other:    New problem(s) identified: No, Describe:     New Short Term/Long Term Goal(s): to be discharged  Discharge Plan or Barriers: Return home with outpatient follow up TBD  Reason for Continuation of Hospitalization: Mania Medication stabilization  Estimated Length of Stay:  1-2 days  Attendees: Patient: Ruth Griffith 12/07/2016 11:25 AM  Physician: Braulio Conte Pucilowska 12/07/2016 11:25 AM  Nursing: Hulan Amato, RN 12/07/2016 11:25 AM  RN Care Manager: 12/07/2016 11:25 AM  Social Worker: Jake Shark, LCSW 12/07/2016 11:25 AM  Recreational Therapist: Garret Reddish, LRT 12/07/2016 11:25 AM  Other:  12/07/2016 11:25 AM  Other:  12/07/2016 11:25 AM  Other: 12/07/2016 11:25 AM    Scribe for Treatment Team: Cleda Daub Anmarie Fukushima, LCSW 12/07/2016 11:25 AM

## 2016-12-07 NOTE — Progress Notes (Signed)
Recreation Therapy Notes  Date: 11.05.18  Time: 9:30 am  Location: Craft Room  Behavioral response: Appropriate  Intervention Topic: Problem solving   Discussion/Intervention: Group content on today was focused on problem solving. The group described what problem solving is. Patients expressed how problems affect them and how they deal with problems. Individuals identified healthy ways to deal with problems. Patients explained what normally happens to them when they do not deal with problems. The group expressed reoccurring problems for them. The group participated in the intervention "Put the story together" with their peers and worked together to put a story that was broken up in the correct order. Clinical Observations/Feedback:  Patient came to group late for unknown reasons. She was engaged with her peers and staff during group the intervention. Patient needed no redirection to stay focused on task/topic during group.   Jamarii Banks LRT/CTRS      Edwin Cherian 12/07/2016 10:16 AM

## 2016-12-07 NOTE — Progress Notes (Signed)
[facility] MD Progress Note  12/07/2016 12:28 PM Ruth Griffith  MRN:  [id number]  Subjective:  [name] feels much better today. She denies any symptoms of depression, anxiety or psychosis. She is cool and collected. Thoughts are no longer disorganized. She would like to be discharged tomorrow.   Treatment plan. We continue Zyprexa 15 mg BID and increase Lithium dose to 600 mg BID since the level is subtherapeutic.   Social/disposition. She will be discharged with family. Follow up with [facility].  Principal Problem: Bipolar I disorder, most recent episode mixed, severe with psychotic features (HCC) Diagnosis:   Patient Active Problem List   Diagnosis Date Noted  . Bipolar I disorder, most recent episode mixed, severe with psychotic features (HCC) [F31.64] 11/30/2016  . Cannabis use disorder, moderate, dependence (HCC) [F12.20] 11/30/2016   Total Time spent with patient: 20 minutes  Past Psychiatric History:  None.  Past Medical History: History reviewed. No pertinent past medical history. History reviewed. No pertinent surgical history. Family History: History reviewed. No pertinent family history. Family Psychiatric  History: Sister with psychotic break. Social History:  Social History   Substance and Sexual Activity  Alcohol Use No     Social History   Substance and Sexual Activity  Drug Use Yes  . Types: Marijuana   Comment: Unknown related to AMS    Social History   Socioeconomic History  . Marital status: Single    Spouse name: None  . Number of children: None  . Years of education: None  . Highest education level: None  Social Needs  . Financial resource strain: None  . Food insecurity - worry: None  . Food insecurity - inability: None  . Transportation needs - medical: None  . Transportation needs - non-medical: None  Occupational History  . None  Tobacco Use  . Smoking status: Unknown If Ever Smoked  Substance and Sexual Activity  . Alcohol use:  No  . Drug use: Yes    Types: Marijuana    Comment: Unknown related to AMS  . Sexual activity: None    Comment: Unknown  Other Topics Concern  . None  Social History Narrative  . None   Additional Social History:                         Sleep: Fair  Appetite:  Fair  Current Medications: Current Facility-Administered Medications  Medication Dose Route Frequency Provider Last Rate Last Dose  . acetaminophen (TYLENOL) tablet 650 mg  650 mg Oral Q6H PRN Clapacs, Jackquline Denmark, MD   650 mg at 12/06/16 0359  . alum & mag hydroxide-simeth (MAALOX/MYLANTA) 200-200-20 MG/5ML suspension 30 mL  30 mL Oral Q4H PRN Clapacs, John T, MD      . lithium carbonate capsule 600 mg  600 mg Oral BID WC Kimbella Heisler B, MD      . magnesium hydroxide (MILK OF MAGNESIA) suspension 30 mL  30 mL Oral Daily PRN Clapacs, Jackquline Denmark, MD   30 mL at 12/06/16 0647  . OLANZapine zydis (ZYPREXA) disintegrating tablet 15 mg  15 mg Oral BID AC Jeremyah Jelley B, MD   15 mg at 12/07/16 0829  . temazepam (RESTORIL) capsule 15 mg  15 mg Oral QHS PRN Chazlyn Cude B, MD   15 mg at 12/03/16 2138    Lab Results:  Results for orders placed or performed during the hospital encounter of 11/30/16 (from the past 48 hour(s))  Lithium level  Status: Abnormal   Collection Time: 12/07/16  6:39 AM  Result Value Ref Range   Lithium Lvl 0.42 (L) 0.60 - 1.20 mmol/L    Blood Alcohol level:  Lab Results  Component Value Date   ETH <10 11/30/2016    Metabolic Disorder Labs: Lab Results  Component Value Date   HGBA1C 5.0 12/01/2016   MPG 96.8 12/01/2016   No results found for: PROLACTIN Lab Results  Component Value Date   CHOL 151 12/01/2016   TRIG 71 12/01/2016   HDL 68 12/01/2016   CHOLHDL 2.2 12/01/2016   VLDL 14 12/01/2016   LDLCALC 69 12/01/2016    Physical Findings: AIMS: Facial and Oral Movements Muscles of Facial Expression: None, normal Lips and Perioral Area: None, normal Jaw: None,  normal Tongue: None, normal,Extremity Movements Upper (arms, wrists, hands, fingers): None, normal Lower (legs, knees, ankles, toes): None, normal, Trunk Movements Neck, shoulders, hips: None, normal, Overall Severity Severity of abnormal movements (highest score from questions above): None, normal Incapacitation due to abnormal movements: None, normal Patient's awareness of abnormal movements (rate only patient's report): No Awareness, Dental Status Current problems with teeth and/or dentures?: No Does patient usually wear dentures?: No  CIWA:    COWS:     Musculoskeletal: Strength & Muscle Tone: within normal limits Gait & Station: normal Patient leans: N/A  Psychiatric Specialty Exam: Physical Exam  Nursing note and vitals reviewed. Psychiatric: She has a normal mood and affect. Her speech is normal and behavior is normal. Judgment and thought content normal. Cognition and memory are normal.    Review of Systems  Neurological: Negative.   Psychiatric/Behavioral: Negative.   All other systems reviewed and are negative.   Blood pressure 138/81, pulse 91, temperature 97.6 F (36.4 C), temperature source Oral, resp. rate 16, height 5\' 8"  (1.727 m), weight 60.8 kg (134 lb), SpO2 100 %.Body mass index is 20.37 kg/m.  General Appearance: Casual  Eye Contact:  Good  Speech:  Clear and Coherent  Volume:  Normal  Mood:  Euthymic  Affect:  Appropriate  Thought Process:  Goal Directed and Descriptions of Associations: Intact  Orientation:  Full (Time, Place, and Person)  Thought Content:  WDL  Suicidal Thoughts:  No  Homicidal Thoughts:  No  Memory:  Immediate;   Fair Recent;   Fair Remote;   Fair  Judgement:  Impaired  Insight:  Shallow  Psychomotor Activity:  Normal  Concentration:  Concentration: Fair and Attention Span: Fair  Recall:  [employer] of Knowledge:  Fair  Language:  Fair  Akathisia:  No  Handed:  Right  AIMS (if indicated):     Assets:  Communication  Skills Desire for Improvement Housing Physical Health Resilience Social Support Talents/Skills Transportation Vocational/Educational  ADL's:  Intact  Cognition:  WNL  Sleep:  Number of Hours: 7.3     Treatment Plan Summary: Daily contact with patient to assess and evaluate symptoms and progress in treatment and Medication management   Ruth Griffith is a 21 year old female with no past psychiatric history admitted for psychotic break, most likely mixed bipolar episode. There is much improvement.    # Agitation, resolved  # Psychosis, resolved - Continue Zyprexa zydis 15 mg BID - Increase Lithium to 600 mg BID, Li level 0.42 - Head CT scan was planned but the mother opposes  # Insomnia, improved  - Lower Restoril to 7.5 mg nightly   # substance abuse  - positive for cannabis  -Patient not motivated for change  #  Metabolic syndrome monitoring  - Lipid panel, TSH and HgbA1C are normal  - EKG, QTc 428  - Pregnancy test is negative   # Disposition  - Discharge with family  - she will [facility]   Simaya Lumadue, MD 12/07/2016, 12:28 PM

## 2016-12-07 NOTE — Progress Notes (Signed)
D- Patient alert and oriented. Patient presents in a pleasant mood and voices readiness to go home. Patient denies SI, HI, AVH, and pain at this time. Patient states that she slept well last night and "feels good" today. Patient reports that her depression is a "2/10" and that her anxiety level is "3/10". Patient also states that her treatment plan "helps a lot with me not freaking out". Patient states that "both of my addictions got out of control" and she has decided not to use "my jewel or marijuana once I get out" because I feel better without it".  A- Scheduled medications administered to patient, per MD orders. Support and encouragement provided.  Routine safety checks conducted every 15 minutes.  Patient informed to notify staff with problems or concerns.  R- No adverse drug reactions noted. Patient contracts for safety at this time. Patient compliant with medications and treatment plan. Patient receptive, calm, and cooperative. Patient interacts well with others on the unit.  Patient remains safe at this time.

## 2016-12-07 NOTE — Progress Notes (Signed)
Patient ID: Ruth Griffith, female   DOB: 07/07/1995, 21 y.o.   MRN: [id number]  CSW spoke with Pt's mother, Ruth Griffith to notify of Pt's pending discharge tomorrow. She is agreeable to discharge and supports plan to follow up with [facility] counseling and [facility] for Medication Management.  She will pick Pt up tomorrow as soon as possible after getting off of work.  [name], LCSW

## 2016-12-07 NOTE — Plan of Care (Signed)
D: Patient appears to be as well as voices that she is free from psychotic symptoms. Patient understands prescribed therapeutic regimen and has no additional questions or concerns. Patient has remained free from injury and has not had any falls during this visit. Patient has been informed of and understands [facility] general education materials. Patient is logical in her approach and expresses understanding in what is being said to her. Patient has been sleeping well and has been participating in the unit group therapy sessions.

## 2016-12-07 NOTE — Progress Notes (Signed)
Recreation Therapy Notes  Date: 11.05.18  Time: 3:00pm  Location: Craft room  Behavioral response: Appropriate  Group Type: Art/Craft,Game  Participation level: Active  Communication: Patient was social with peers and staff.  Comments: N/A  Liviana Mills LRT/CTRS        Maynor Mwangi 12/07/2016 4:09 PM

## 2016-12-08 NOTE — Progress Notes (Addendum)
Recreation Therapy Notes  Date: 11.06.18  Time: 9:30 am   Location: Craft Room  Behavioral response: Appropriate  Intervention Topic: Anger Management  Discussion/Intervention: Group content on today was focused on anger management. The group defined anger and reasons they become angry. Individuals expressed negative way they have dealt with anger in the past. Patients stated some positive ways they could deal with anger in the future. The group described how anger can affect your health and daily plans. Individuals participated in the intervention "Score your anger" where they had a chance to answer questions about themselves and get a score of their anger.   Patient came to group and stated that  She associates yelling with anger. Individual described anger as having a negative attitude. Patient was pulled from group by another discipline but later returned.While in group individual was pleasant with peers and staff and stayed focused on topic/task at hand.    [name] LRT/CTRS        Ruth Griffith 12/08/2016 11:09 AM

## 2016-12-08 NOTE — [facility] Group Notes (Signed)
LCSW Group Therapy Note 12/08/2016 9:00am  Type of Therapy and Topic:  Group Therapy:  Setting Goals  Participation Level:  Minimal  Description of Group: In this process group, patients discussed using strengths to work toward goals and address challenges.  Patients identified two positive things about themselves and one goal they were working on.  Patients were given the opportunity to share openly and support each other's plan for self-empowerment.  The group discussed the value of gratitude and were encouraged to have a daily reflection of positive characteristics or circumstances.  Patients were encouraged to identify a plan to utilize their strengths to work on current challenges and goals.  Therapeutic Goals 1. Patient will verbalize personal strengths/positive qualities and relate how these can assist with achieving desired personal goals 2. Patients will verbalize affirmation of peers plans for personal change and goal setting 3. Patients will explore the value of gratitude and positive focus as related to successful achievement of goals 4. Patients will verbalize a plan for regular reinforcement of personal positive qualities and circumstances.  Summary of Patient Progress:  Pt attended very end of group meeting but contributed her goal to "manage anxiety about discharge".    Therapeutic Modalities Cognitive Behavioral Therapy Motivational Interviewing    Glennon MacSara P Murl Golladay, LCSW 12/08/2016 10:22 AM

## 2016-12-08 NOTE — Progress Notes (Signed)
Patient denies SI/HI, denies A/V hallucinations. Patient verbalizes understanding of discharge instructions, follow up care and prescriptions.7 days medicines given to patient. Patient given all belongings from  locker. Patient escorted out by staff, transported by family. 

## 2016-12-08 NOTE — Progress Notes (Signed)
Recreation Therapy Notes  INPATIENT RECREATION TR PLAN  Patient Details Name: Ruth Griffith MRN: [id number] DOB: 12-02-1995 Today's Date: 12/08/2016  Rec Therapy Plan Is patient appropriate for Therapeutic Recreation?: Yes Treatment times per week: at least 3 Estimated Length of Stay: 5-7 days TR Treatment/Interventions: Group participation (Comment)(Appropriate participation in recreation therapy tx.)  Discharge Criteria Pt will be discharged from therapy if:: Discharged Treatment plan/goals/alternatives discussed and agreed upon by:: Patient/family  Discharge Summary Short term goals set: Patient will focus on task/topic with 2 prompts from staff x7 days. Short term goals met: Complete Progress toward goals comments: Groups attended Which groups?: Stress management, Anger management, Other (Comment)(Problem Solving, Team Building, Emotions, Values) Reason goals not met: N/A Therapeutic equipment acquired: N/A Reason patient discharged from therapy: Discharge from hospital Pt/family agrees with progress & goals achieved: Yes Date patient discharged from therapy: 12/08/16   Harald Quevedo 12/08/2016, 11:49 AM

## 2016-12-08 NOTE — [facility] Group Notes (Signed)
[facility] Group Notes:  (Nursing/MHT/Case Management/Adjunct)  Date:  12/08/2016  Time:  5:52 AM  Type of Therapy:  Psychoeducational Skills  Participation Level:  Active  Participation Quality:  Appropriate and Attentive  Affect:  Appropriate  Cognitive:  Appropriate  Insight:  Appropriate and Good  Engagement in Group:  Engaged  Modes of Intervention:  Discussion, Socialization and Support  Summary of Progress/Problems:  Chancy MilroyLaquanda Y Nealy Griffith 12/08/2016, 5:52 AM

## 2016-12-08 NOTE — Progress Notes (Signed)
  [facility] :  Will you be returning to the same living situation after discharge:  Yes,  with mother At discharge, do you have transportation home?: Yes,  mother Do you have the ability to pay for your medications: No.Medication management clinic referral completed.  Release of information consent forms completed and in the chart;  Patient's signature needed at discharge.  Patient to Follow up at: Follow-up Information    [facility] Go on 12/09/2016.   Why:  7:15am, for Hospital Follow up, Peer support services with Unk PintoHarvey Bryant [phone] and Medication Managment. Contact information: [address] [place] [address] [phone]        [facility]. Go on 12/10/2016.   Why:  1:00pm, For continued support from advocates. Contact information: [address], [place], [place] [address] [phone]          Next level of care provider has [facility] Link:no  Safety Planning and Suicide Prevention discussed: Yes,  with mother  Have you used any form of tobacco in the last 30 days? (Cigarettes, Smokeless Tobacco, Cigars, and/or Pipes): Patient Refused Screening  Has patient been referred to the Quitline?: Patient refused referral  Patient has been referred for addiction treatment: Yes  Lorri FrederickWierda, Jasira Robinson Jon, LCSW 12/08/2016, 10:38 AM

## 2016-12-08 NOTE — [facility] Suicide Risk Assessment (Signed)
[facility]   Principal Problem: Bipolar I disorder, most recent episode mixed, severe with psychotic features [facility](HCC) Discharge Diagnoses:  Patient Active Problem List   Diagnosis Date Noted  . Bipolar I disorder, most recent episode mixed, severe with psychotic features (HCC) [F31.64] 11/30/2016  . Cannabis use disorder, moderate, dependence (HCC) [F12.20] 11/30/2016    Total Time spent with patient: 30 minutes  Musculoskeletal: Strength & Muscle Tone: within normal limits Gait & Station: normal Patient leans: N/A  Psychiatric Specialty Exam: Review of Systems  Neurological: Negative.   Psychiatric/Behavioral: Negative.   All other systems reviewed and are negative.   Blood pressure (!) 142/75, pulse 86, temperature 98 F (36.7 C), temperature source Oral, resp. rate 18, height 5\' 8"  (1.727 m), weight 60.8 kg (134 lb), SpO2 100 %.Body mass index is 20.37 kg/m.  General Appearance: Casual  Eye Contact::  Good  Speech:  Clear and Coherent409  Volume:  Normal  Mood:  Euthymic  Affect:  Appropriate  Thought Process:  Goal Directed and Descriptions of Associations: Intact  Orientation:  Full (Time, Place, and Person)  Thought Content:  WDL  Suicidal Thoughts:  No  Homicidal Thoughts:  No  Memory:  Immediate;   Fair Recent;   Fair Remote;   Fair  Judgement:  Impaired  Insight:  Present  Psychomotor Activity:  Normal  Concentration:  Fair  Recall:  [employer]  Fund of Knowledge:Fair  Language: Fair  Akathisia:  No  Handed:  Right  AIMS (if indicated):     Assets:  Communication Skills Desire for Improvement Housing Physical Health Resilience Social Support Transportation Vocational/Educational  Sleep:  Number of Hours: 7.25  Cognition: WNL  ADL's:  Intact   Mental Status Per Nursing Assessment::   On Admission:     Demographic Factors:  Adolescent or young adult  Loss Factors: Decrease in vocational status  Historical  Factors: Impulsivity  Risk Reduction Factors:   Sense of responsibility to family, Living with another person, especially a relative and Positive social support  Continued Clinical Symptoms:  Bipolar Disorder:   Mixed State Alcohol/Substance Abuse/Dependencies  Cognitive Features That Contribute To Risk:  None    Suicide Risk:  Minimal: No identifiable suicidal ideation.  Patients presenting with no risk factors but with morbid ruminations; may be classified as minimal risk based on the severity of the depressive symptoms  Follow-up Information    [facility] Go on 12/09/2016.   Why:  7:15am, for Hospital Follow up, Peer support services with Unk PintoHarvey Bryant [phone] and Medication Managment. Contact information: [address] [place] [address] [phone]        [facility]. Go on 12/10/2016.   Why:  1:00pm, For continued support from advocates. Contact information: [address], [place], [place] [address] [phone]          Plan Of Care/Follow-up recommendations:  Activity:  as tolerated Diet:  regular Other:  keep follow up appointments  Kristine LineaJolanta Pucilowska, MD 12/08/2016, 7:32 AM
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
## 2016-12-09 ENCOUNTER — Encounter (HOSPITAL_BASED_OUTPATIENT_CLINIC_OR_DEPARTMENT_OTHER): Payer: Self-pay | Admitting: Specialist

## 2016-12-15 ENCOUNTER — Ambulatory Visit: Payer: Self-pay | Admitting: Pharmacy Technician

## 2016-12-15 DIAGNOSIS — Z79899 Other long term (current) drug therapy: Secondary | ICD-10-CM

## 2016-12-15 NOTE — Progress Notes (Signed)
Met with patient completed financial assistance application for [place] due to recent hospital visit.  Patient agreed to be responsible for gathering financial information and forwarding to appropriate department in [facility].    Completed [facility] application and contract.  Patient agreed to all terms of the [facility] contract.  Patient to provide notarized letter of support, checking and savings statements and 2017 taxes.  Provided patient with community resource material based on her particular needs.    [facility]

## 2016-12-18 ENCOUNTER — Telehealth: Payer: Self-pay | Admitting: Pharmacy Technician

## 2016-12-18 NOTE — Telephone Encounter (Signed)
Pt provided 2017 tax return.  Block indicating health care coverage selected on tax return.  Patient states that she was covered by Mom's insurance-BC[employer].  To provide letter indicating coverage has ended.  [facility].  [facility] that [facility] not [facility].  Sherilyn DacostaBetty J. Storm Sovine Care Manager [facility]

## 2017-03-11 ENCOUNTER — Telehealth: Payer: Self-pay | Admitting: Pharmacy Technician

## 2017-03-11 NOTE — Telephone Encounter (Signed)
Patient has [facility] [employer](Raymond Kitchens).  No additional medication assistance will be provided by [facility].  Patient notified by letter.  Sherilyn DacostaBetty J. Kluttz Care Manager [facility]

## 2018-08-08 ENCOUNTER — Encounter: Payer: Self-pay | Admitting: Advanced Practice Midwife

## 2018-08-08 ENCOUNTER — Other Ambulatory Visit: Payer: Self-pay

## 2018-08-08 ENCOUNTER — Ambulatory Visit (INDEPENDENT_AMBULATORY_CARE_PROVIDER_SITE_OTHER): Payer: Medicaid Other | Admitting: Advanced Practice Midwife

## 2018-08-08 ENCOUNTER — Other Ambulatory Visit (HOSPITAL_COMMUNITY)
Admission: RE | Admit: 2018-08-08 | Discharge: 2018-08-08 | Disposition: A | Discharge status: Home / Self Care | Payer: Medicaid Other | Source: Ambulatory Visit | Attending: Advanced Practice Midwife | Admitting: Advanced Practice Midwife

## 2018-08-08 VITALS — BP 118/76 | Wt 185.0 lb

## 2018-08-08 DIAGNOSIS — Z3A10 10 weeks gestation of pregnancy: Secondary | ICD-10-CM

## 2018-08-08 DIAGNOSIS — Z3401 Encounter for supervision of normal first pregnancy, first trimester: Secondary | ICD-10-CM | POA: Diagnosis not present

## 2018-08-08 DIAGNOSIS — Z124 Encounter for screening for malignant neoplasm of cervix: Secondary | ICD-10-CM | POA: Diagnosis present

## 2018-08-08 DIAGNOSIS — Z3480 Encounter for supervision of other normal pregnancy, unspecified trimester: Secondary | ICD-10-CM | POA: Insufficient documentation

## 2018-08-08 DIAGNOSIS — Z113 Encounter for screening for infections with a predominantly sexual mode of transmission: Secondary | ICD-10-CM | POA: Diagnosis present

## 2018-08-08 NOTE — Patient Instructions (Signed)
Exercise During Pregnancy Exercise is an important part of being healthy for people of all ages. Exercise improves the function of your heart and lungs and helps you maintain strength, flexibility, and a healthy body weight. Exercise also boosts energy levels and elevates mood. Most women should exercise regularly during pregnancy. In rare cases, women with certain medical conditions or complications may be asked to limit or avoid exercise during pregnancy. How does this affect me? Along with maintaining general strength and flexibility, exercising during pregnancy can help:  Keep strength in muscles that are used during labor and childbirth.  Decrease low back pain.  Reduce symptoms of depression.  Control weight gain during pregnancy.  Reduce the risk of needing insulin if you develop diabetes during pregnancy.  Decrease the risk of cesarean delivery.  Speed up your recovery after giving birth. How does this affect my baby? Exercise can help you have a healthy pregnancy. Exercise does not cause premature birth. It will not cause your baby to weigh less at birth. What exercises can I do? Many exercises are safe for you to do during pregnancy. Do a variety of exercises that safely increase your heart and breathing rates and help you build and maintain muscle strength. Do exercises exactly as told by your health care provider. You may do these exercises:  Walking or hiking.  Swimming.  Water aerobics.  Riding a stationary bike.  Strength training.  Modified yoga or Pilates. Tell your instructor that you are pregnant. Avoid overstretching, and avoid lying on your back for long periods of time.  Running or jogging. Only choose this type of exercise if you: ? Ran or jogged regularly before your pregnancy. ? Can run or jog and still talk in complete sentences. What exercises should I avoid? Depending on your level of fitness and whether you exercised regularly before your  pregnancy, you may be told to limit high-intensity exercise. You can tell that you are exercising at a high intensity if you are breathing much harder and faster and cannot hold a conversation while exercising. You must avoid:  Contact sports.  Activities that put you at risk for falling on or being hit in the belly, such as downhill skiing, water skiing, surfing, rock climbing, cycling, gymnastics, and horseback riding.  Scuba diving.  Skydiving.  Yoga or Pilates in a room that is heated to high temperatures.  Jogging or running, unless you ran or jogged regularly before your pregnancy. While jogging or running, you should always be able to talk in full sentences. Do not run or jog so fast that you are unable to have a conversation.  Do not exercise at more than 6,000 feet above sea level (high elevation) if you are not used to exercising at high elevation. How do I exercise in a safe way?   Avoid overheating. Do not exercise in very high temperatures.  Wear loose-fitting, breathable clothes.  Avoid dehydration. Drink enough water before, during, and after exercise to keep your urine pale yellow.  Avoid overstretching. Because of hormone changes during pregnancy, it is easy to overstretch muscles, tendons, and ligaments during pregnancy.  Start slowly and ask your health care provider to recommend the types of exercise that are safe for you.  Do not exercise to lose weight. Follow these instructions at home:  Exercise on most days or all days of the week. Try to exercise for 30 minutes a day, 5 days a week, unless your health care provider tells you not to.  If   you actively exercised before your pregnancy and you are healthy, your health care provider may tell you to continue to do moderate to high-intensity exercise.  If you are just starting to exercise or did not exercise much before your pregnancy, your health care provider may tell you to do low to moderate-intensity  exercise. Questions to ask your health care provider  Is exercise safe for me?  What are signs that I should stop exercising?  Does my health condition mean that I should not exercise during pregnancy?  When should I avoid exercising during pregnancy? Stop exercising and contact a health care provider if: You have any unusual symptoms, such as:  Mild contractions of the uterus or cramps in the abdomen.  Dizziness that does not go away when you rest. Stop exercising and get help right away if: You have any unusual symptoms, such as:  Sudden, severe pain in your low back or your belly.  Mild contractions of the uterus or cramps in the abdomen that do not improve with rest and drinking fluids.  Chest pain.  Bleeding or fluid leaking from your vagina.  Shortness of breath. These symptoms may represent a serious problem that is an emergency. Do not wait to see if the symptoms will go away. Get medical help right away. Call your local emergency services (911 in the [place] Do not drive yourself to the hospital. Summary  Most women should exercise regularly throughout pregnancy. In rare cases, women with certain medical conditions or complications may be asked to limit or avoid exercise during pregnancy.  Do not exercise to lose weight during pregnancy.  Your health care provider will tell you what level of physical activity is right for you.  Stop exercising and contact a health care provider if you have mild contractions of the uterus or cramps in the abdomen. Get help right away if these contractions or cramps do not improve with rest and drinking fluids.  Stop exercising and get help right away if you have sudden, severe pain in your low back or belly, chest pain, shortness of breath, or bleeding or leaking of fluid from your vagina. This information is not intended to replace advice given to you by your health care provider. Make sure you discuss any questions you have with your  health care provider. Document Released: 01/19/2005 Document Revised: 05/12/2018 Document Reviewed: 02/23/2018 Elsevier Patient Education  2020 [employer] Eating Plan for Pregnant Women While you are pregnant, your body requires additional nutrition to help support your growing baby. You also have a higher need for some vitamins and minerals, such as folic acid, calcium, iron, and vitamin D. Eating a healthy, well-balanced diet is very important for your health and your baby's health. Your need for extra calories varies for the three 3-month segments of your pregnancy (trimesters). For most women, it is recommended to consume:  150 extra calories a day during the first trimester.  300 extra calories a day during the second trimester.  300 extra calories a day during the third trimester. What are tips for following this plan?   Do not try to lose weight or go on a diet during pregnancy.  Limit your overall intake of foods that have "empty calories." These are foods that have little nutritional value, such as sweets, desserts, candies, and sugar-sweetened beverages.  Eat a variety of foods (especially fruits and vegetables) to get a full range of vitamins and minerals.  Take a prenatal vitamin to help meet your additional vitamin   and mineral needs during pregnancy, specifically for folic acid, iron, calcium, and vitamin D.  Remember to stay active. Ask your health care provider what types of exercise and activities are safe for you.  Practice good food safety and cleanliness. Wash your hands before you eat and after you prepare raw meat. Wash all fruits and vegetables well before peeling or eating. Taking these actions can help to prevent food-borne illnesses that can be very dangerous to your baby, such as listeriosis. Ask your health care provider for more information about listeriosis. What does 150 extra calories look like? Healthy options that provide 150 extra calories each day  could be any of the following:  6-8 oz (170-230 g) of plain low-fat yogurt with  cup of berries.  1 apple with 2 teaspoons (11 g) of peanut butter.  Cut-up vegetables with  cup (60 g) of hummus.  8 oz (230 mL) or 1 cup of low-fat chocolate milk.  1 stick of string cheese with 1 medium orange.  1 peanut butter and jelly sandwich that is made with one slice of whole-wheat bread and 1 tsp (5 g) of peanut butter. For 300 extra calories, you could eat two of those healthy options each day. What is a healthy amount of weight to gain? The right amount of weight gain for you is based on your BMI before you became pregnant. If your BMI:  Was less than 18 (underweight), you should gain 28-40 lb (13-18 kg).  Was 18-24.9 (normal), you should gain 25-35 lb (11-16 kg).  Was 25-29.9 (overweight), you should gain 15-25 lb (7-11 kg).  Was 30 or greater (obese), you should gain 11-20 lb (5-9 kg). What if I am having twins or multiples? Generally, if you are carrying twins or multiples:  You may need to eat 300-600 extra calories a day.  The recommended range for total weight gain is 25-54 lb (11-25 kg), depending on your BMI before pregnancy.  Talk with your health care provider to find out about nutritional needs, weight gain, and exercise that is right for you. What foods can I eat?  Grains All grains. Choose whole grains, such as whole-wheat bread, oatmeal, or brown rice. Vegetables All vegetables. Eat a variety of colors and types of vegetables. Remember to wash your vegetables well before peeling or eating. Fruits All fruits. Eat a variety of colors and types of fruit. Remember to wash your fruits well before peeling or eating. Meats and other protein foods Lean meats, including chicken, turkey, fish, and lean cuts of beef, veal, or pork. If you eat fish or seafood, choose options that are higher in omega-3 fatty acids and lower in mercury, such as salmon, herring, mussels, trout,  sardines, pollock, shrimp, crab, and lobster. Tofu. Tempeh. Beans. Eggs. Peanut butter and other nut butters. Make sure that all meats, poultry, and eggs are cooked to food-safe temperatures or "well-done." Two or more servings of fish are recommended each week in order to get the most benefits from omega-3 fatty acids that are found in seafood. Choose fish that are lower in mercury. You can find more information online:  www.fda.gov Dairy Pasteurized milk and milk alternatives (such as almond milk). Pasteurized yogurt and pasteurized cheese. Cottage cheese. Sour cream. Beverages Water. Juices that contain 100% fruit juice or vegetable juice. Caffeine-free teas and decaffeinated coffee. Drinks that contain caffeine are okay to drink, but it is better to avoid caffeine. Keep your total caffeine intake to less than 200 mg each day (which   is 12 oz or 355 mL of coffee, tea, or soda) or the limit as told by your health care provider. Fats and oils Fats and oils are okay to include in moderation. Sweets and desserts Sweets and desserts are okay to include in moderation. Seasoning and other foods All pasteurized condiments. The items listed above may not be a complete list of recommended foods and beverages. Contact your dietitian for more options. The items listed above may not be a complete list of foods and beverages [you/your child] can eat. Contact a dietitian for more information. What foods are not recommended? Vegetables Raw (unpasteurized) vegetable juices. Fruits Unpasteurized fruit juices. Meats and other protein foods Lunch meats, bologna, hot dogs, or other deli meats. (If you must eat those meats, reheat them until they are steaming hot.) Refrigerated pat, meat spreads from a meat counter, smoked seafood that is found in the refrigerated section of a store. Raw or undercooked meats, poultry, and eggs. Raw fish, such as sushi or sashimi. Fish that have high mercury content, such as  tilefish, shark, swordfish, and king mackerel. To learn more about mercury in fish, talk with your health care provider or look for online resources, such as:  www.fda.gov Dairy Raw (unpasteurized) milk and any foods that have raw milk in them. Soft cheeses, such as feta, queso blanco, queso fresco, Brie, Camembert cheeses, blue-veined cheeses, and Panela cheese (unless it is made with pasteurized milk, which must be stated on the label). Beverages Alcohol. Sugar-sweetened beverages, such as sodas, teas, or energy drinks. Seasoning and other foods Homemade fermented foods and drinks, such as pickles, sauerkraut, or kombucha drinks. (Store-bought pasteurized versions of these are okay.) Salads that are made in a store or deli, such as ham salad, chicken salad, egg salad, tuna salad, and seafood salad. The items listed above may not be a complete list of foods and beverages to avoid. Contact your dietitian for more information. The items listed above may not be a complete list of foods and beverages [you/your child] should avoid. Contact a dietitian for more information. Where to find more information To calculate the number of calories you need based on your height, weight, and activity level, you can use an online calculator such as:  www.choosemyplate.gov/MyPlatePlan To calculate how much weight you should gain during pregnancy, you can use an online pregnancy weight gain calculator such as:  www.choosemyplate.gov/pregnancy-weight-gain-calculator Summary  While you are pregnant, your body requires additional nutrition to help support your growing baby.  Eat a variety of foods, especially fruits and vegetables to get a full range of vitamins and minerals.  Practice good food safety and cleanliness. Wash your hands before you eat and after you prepare raw meat. Wash all fruits and vegetables well before peeling or eating. Taking these actions can help to prevent food-borne illnesses, such as  listeriosis, that can be very dangerous to your baby.  Do not eat raw meat or fish. Do not eat fish that have high mercury content, such as tilefish, shark, swordfish, and king mackerel. Do not eat unpasteurized (raw) dairy.  Take a prenatal vitamin to help meet your additional vitamin and mineral needs during pregnancy, specifically for folic acid, iron, calcium, and vitamin D. This information is not intended to replace advice given to you by your health care provider. Make sure you discuss any questions you have with your health care provider. Document Released: 11/03/2013 Document Revised: 05/12/2018 Document Reviewed: 10/16/2016 Elsevier Patient Education  2020 Else[employer] Inc. Prenatal Care Prenatal care   is health care during pregnancy. It helps you and your unborn baby (fetus) stay as healthy as possible. Prenatal care may be provided by a midwife, a family practice health care provider, or a childbirth and pregnancy specialist (obstetrician). How does this affect me? During pregnancy, you will be closely monitored for any new conditions that might develop. To lower your risk of pregnancy complications, you and your health care provider will talk about any underlying conditions you have. How does this affect my baby? Early and consistent prenatal care increases the chance that your baby will be healthy during pregnancy. Prenatal care lowers the risk that your baby will be:  Born early (prematurely).  Smaller than expected at birth (small for gestational age). What can I expect at the first prenatal care visit? Your first prenatal care visit will likely be the longest. You should schedule your first prenatal care visit as soon as you know that you are pregnant. Your first visit is a good time to talk about any questions or concerns you have about pregnancy. At your visit, you and your health care provider will talk about:  Your medical history, including: ? Any past pregnancies. ? Your  family's medical history. ? The baby's father's medical history. ? Any long-term (chronic) health conditions you have and how you manage them. ? Any surgeries or procedures you have had. ? Any current over-the-counter or prescription medicines, herbs, or supplements you are taking.  Other factors that could pose a risk to your baby, including:  Your home setting and your stress levels, including: ? Exposure to abuse or violence. ? Household financial strain. ? Mental health conditions you have.  Your daily health habits, including diet and exercise. Your health care provider will also:  Measure your weight, height, and blood pressure.  Do a physical exam, including a pelvic and breast exam.  Perform blood tests and urine tests to check for: ? Urinary tract infection. ? Sexually transmitted infections (STIs). ? Low iron levels in your blood (anemia). ? Blood type and certain proteins on red blood cells (Rh antibodies). ? Infections and immunity to viruses, such as hepatitis B and rubella. ? HIV (human immunodeficiency virus).  Do an ultrasound to confirm your baby's growth and development and to help predict your estimated due date (EDD). This ultrasound is done with a probe that is inserted into the vagina (transvaginal ultrasound).  Discuss your options for genetic screening.  Give you information about how to keep yourself and your baby healthy, including: ? Nutrition and taking vitamins. ? Physical activity. ? How to manage pregnancy symptoms such as nausea and vomiting (morning sickness). ? Infections and substances that may be harmful to your baby and how to avoid them. ? Food safety. ? Dental care. ? Working. ? Travel. ? Warning signs to watch for and when to call your health care provider. How often will I have prenatal care visits? After your first prenatal care visit, you will have regular visits throughout your pregnancy. The visit schedule is often as follows:   Up to week 28 of pregnancy: once every 4 weeks.  28-36 weeks: once every 2 weeks.  After 36 weeks: every week until delivery. Some women may have visits more or less often depending on any underlying health conditions and the health of the baby. Keep all follow-up and prenatal care visits as told by your health care provider. This is important. What happens during routine prenatal care visits? Your health care provider will:  Measure your   weight and blood pressure.  Check for fetal heart sounds.  Measure the height of your uterus in your abdomen (fundal height). This may be measured starting around week 20 of pregnancy.  Check the position of your baby inside your uterus.  Ask questions about your diet, sleeping patterns, and whether you can feel the baby move.  Review warning signs to watch for and signs of labor.  Ask about any pregnancy symptoms you are having and how you are dealing with them. Symptoms may include: ? Headaches. ? Nausea and vomiting. ? Vaginal discharge. ? Swelling. ? Fatigue. ? Constipation. ? Any discomfort, including back or pelvic pain. Make a list of questions to ask your health care provider at your routine visits. What tests might I have during prenatal care visits? You may have blood, urine, and imaging tests throughout your pregnancy, such as:  Urine tests to check for glucose, protein, or signs of infection.  Glucose tests to check for a form of diabetes that can develop during pregnancy (gestational diabetes mellitus). This is usually done around week 24 of pregnancy.  An ultrasound to check your baby's growth and development and to check for birth defects. This is usually done around week 20 of pregnancy.  A test to check for group B strep (GBS) infection. This is usually done around week 36 of pregnancy.  Genetic testing. This may include blood or imaging tests, such as an ultrasound. Some genetic tests are done during the first trimester  and some are done during the second trimester. What else can I expect during prenatal care visits? Your health care provider may recommend getting certain vaccines during pregnancy. These may include:  A yearly flu shot (annual influenza vaccine). This is especially important if you will be pregnant during flu season.  Tdap (tetanus, diphtheria, pertussis) vaccine. Getting this vaccine during pregnancy can protect your baby from whooping cough (pertussis) after birth. This vaccine may be recommended between weeks 27 and 36 of pregnancy. Later in your pregnancy, your health care provider may give you information about:  Childbirth and breastfeeding classes.  Choosing a health care provider for your baby.  Umbilical cord banking.  Breastfeeding.  Birth control after your baby is born.  The hospital labor and delivery unit and how to tour it.  Registering at the hospital before you go into labor. Where to find more information  Office on Women's Health: womenshealth.gov  American Pregnancy Association: americanpregnancy.org  March of Dimes: marchofdimes.org Summary  Prenatal care helps you and your baby stay as healthy as possible during pregnancy.  Your first prenatal care visit will most likely be the longest.  You will have visits and tests throughout your pregnancy to monitor your health and your baby's health.  Bring a list of questions to your visits to ask your health care provider.  Make sure to keep all follow-up and prenatal care visits with your health care provider. This information is not intended to replace advice given to you by your health care provider. Make sure you discuss any questions you have with your health care provider. Document Released: 01/22/2003 Document Revised: 05/11/2018 Document Reviewed: 01/18/2017 Elsevier Patient Education  2020 Elsevier Inc.    COVID-19 and Your Pregnancy FAQ  How can I prevent infection with COVID-19 during my  pregnancy? Social distancing is key. Please limit any interactions in public. Try and work from home if possible. Frequently wash your hands after touching possibly contaminated surfaces. Avoid touching your face.  Minimize trips to   the store. Consider online ordering when possible.   Should I wear a mask? YES. It is recommended by the CDC that all people wear a cloth mask or facial covering in public. You should wear a mask to your visits in the office. This will help reduce transmission as well as your risk or acquiring COVID-19. New studies are showing that even asymptomatic individuals can spread the virus from talking.   Where can I get a mask? [place] and the city of [place] are partnering to provide masks to community members. You can pick up a mask from several locations. This website also has instructions about how to make a mask by sewing or without sewing by using a t-shirt or bandana.  https://www.[place].gov/i-want-to/learn-about/covid-19-information-and-updates/covid-19-face-mask-project  Studies have shown that if you were a tube or nylon stocking from pantyhose over a cloth mask it makes the cloth mask almost as effective as a N95 mask.  https://www.npr.org/sections/goatsandsoda/2018/05/25/840146830/adding-a-nylon-stocking-layer-could-boost-protection-from-cloth-masks-study-find  What are the symptoms of COVID-19? Fever (greater than 100.4 F), dry cough, shortness of breath.  Am I more at risk for COVID-19 since I am pregnant? There is not currently data showing that pregnant women are more adversely impacted by COVID-19 than the general population. However, we know that pregnant women tend to have worse respiratory complications from similar diseases such as the flu and SARS and for this reason should be considered an at-risk population.  What do I do if I am experiencing the symptoms of COVID-19? Testing is being limited because of test availability. If you are  experiencing symptoms you should quarantine yourself, and the members of your family, for at least 2 weeks at home.   Please visit this website for more information: https://www.cdc.gov/coronavirus/2019-ncov/if-you-are-sick/steps-when-sick.html  When should I go to the Emergency Room? Please go to the emergency room if you are experiencing ANY of these symptoms*:  1.    Difficulty breathing or shortness of breath 2.    Persistent pain or pressure in the chest 3.    Confusion or difficulty being aroused (or awakened) 4.    Bluish lips or face  *This list is not all inclusive. Please consult our office for any other symptoms that are severe or concerning.  What do I do if I am having difficulty breathing? You should go to the Emergency Room for evaluation. At this time they have a tent set up for evaluating patients with COVID-19 symptoms.   How will my prenatal care be different because of the COVID-19 pandemic? It has been recommended to reduce the frequency of face-to-face visits and use resources such as telephone and virtual visits when possible. Using a scale, blood pressure machine and fetal doppler at home can further help reduce face-to-face visits. You will be provided with additional information on this topic.  We ask that you come to your visits alone to minimize potential exposures to  COVID-19.  How can I receive childbirth education? At this time in-person classes have been cancelled. You can register for online childbirth education, breastfeeding, and newborn care classes.  Please visit:  [email or web address] for more information  How will my hospital birth experience be different? The hospital is currently limiting visitors. This means that while you are in labor you can only have one person at the hospital with you. Additional family members will not be allowed to wait in the building or outside your room. Your one support person can be the father of the baby, a  relative, a doula, or a friend. Once one support person   is designated that person will wear a band. This band cannot be shared with multiple people.  Nitrous Gas is not being offered for pain relief since the tubing and filter for the machine can not be sanitized in a way to guarantee prevention of transmission of COVID-19.  Nasal cannula use of oxygen for fetal indications has also been discontinued.  Currently a clear plastic sheet is being hung between mom and the delivering provider during pushing and delivery to help prevent transmission of COVID-19.      How long will I stay in the hospital for after giving birth? It is also recommended that discharge home be expedited during the COVID-19 outbreak. This means staying for 1 day after a vaginal delivery and 2 days after a cesarean section. Patients who need to stay longer for medical reasons are allowed to do so, but the goal will be for expedited discharge home.   What if I have COVID-19 and I am in labor? We ask that you wear a mask while on labor and delivery. We will try and accommodate you being placed in a room that is capable of filtering the air. Please call ahead if you are in labor and on your way to the hospital. The phone number for labor and delivery at [facility] is [phone].  If I have COVID-19 when my baby is born how can I prevent my baby from contracting COVID-19? This is an issue that will have to be discussed on a case-by-case basis. Current recommendations suggest providing separate isolation rooms for both the mother and new infant as well as limiting visitors. However, there are practical challenges to this recommendation. The situation will assuredly change and decisions will be influenced by the desires of the mother and availability of space.  Some suggestions are the use of a curtain or physical barrier between mom and infant, hand hygiene, mom wearing a mask, or 6 feet of spacing between a  mom and infant.   Can I breastfeed during the COVID-19 pandemic?   Yes, breastfeeding is encouraged.  Can I breastfeed if I have COVID-19? Yes. Covid-19 has not been found in breast milk. This means you cannot give COVID-19 to your child through breast milk. Breast feeding will also help pass antibodies to fight infection to your baby.   What precautions should I take when breastfeeding if I have COVID-19? If a mother and newborn do room-in and the mother wishes to feed at the breast, she should put on a facemask and practice hand hygiene before each feeding.  What precautions should I take when pumping if I have COVID-19? Prior to expressing breast milk, mothers should practice hand hygiene. After each pumping session, all parts that come into contact with breast milk should be thoroughly washed and the entire pump should be appropriately disinfected per the manufacturer's instructions. This expressed breast milk should be fed to the newborn by a healthy caregiver.  What if I am pregnant and work in healthcare? Based on limited data regarding COVID-19 and pregnancy, ACOG currently does not propose creating additional restrictions on pregnant health care personnel because of COVID-19 alone. Pregnant women do not appear to be at higher risk of severe disease related to COVID-19. Pregnant health care personnel should follow CDC risk assessment and infection control guidelines for health care personnel exposed to patients with suspected or confirmed COVID-19. Adherence to recommended infection prevention and control practices is an important part of protecting all health care personnel in   health care settings.    Information on COVID-19 in pregnancy is very limited; however, facilities may want to consider limiting exposure of pregnant health care personnel to patients with confirmed or suspected COVID-19 infection, especially during higher-risk procedures (eg, aerosol-generating procedures), if  feasible, based on staffing availability.    

## 2018-08-08 NOTE — Progress Notes (Signed)
NOB today.  

## 2018-08-09 ENCOUNTER — Other Ambulatory Visit: Payer: Self-pay | Admitting: Advanced Practice Midwife

## 2018-08-09 ENCOUNTER — Other Ambulatory Visit: Payer: Medicaid Other

## 2018-08-09 DIAGNOSIS — Z3491 Encounter for supervision of normal pregnancy, unspecified, first trimester: Secondary | ICD-10-CM

## 2018-08-10 ENCOUNTER — Encounter: Payer: Self-pay | Admitting: Advanced Practice Midwife

## 2018-08-10 ENCOUNTER — Ambulatory Visit (INDEPENDENT_AMBULATORY_CARE_PROVIDER_SITE_OTHER): Payer: Medicaid Other

## 2018-08-10 ENCOUNTER — Other Ambulatory Visit: Payer: Self-pay

## 2018-08-10 DIAGNOSIS — Z3480 Encounter for supervision of other normal pregnancy, unspecified trimester: Secondary | ICD-10-CM

## 2018-08-10 DIAGNOSIS — O3481 Maternal care for other abnormalities of pelvic organs, first trimester: Secondary | ICD-10-CM | POA: Diagnosis not present

## 2018-08-10 DIAGNOSIS — N8312 Corpus luteum cyst of left ovary: Secondary | ICD-10-CM

## 2018-08-10 DIAGNOSIS — Z3A10 10 weeks gestation of pregnancy: Secondary | ICD-10-CM | POA: Diagnosis not present

## 2018-08-10 HISTORY — DX: Encounter for supervision of other normal pregnancy, unspecified trimester: Z34.80

## 2018-08-10 LAB — URINE DRUG PANEL 7
Amphetamines, Urine: NEGATIVE ng/mL
Barbiturate Quant, Ur: NEGATIVE ng/mL
Benzodiazepine Quant, Ur: NEGATIVE ng/mL
Cannabinoid Quant, Ur: NEGATIVE ng/mL
Cocaine (Metab.): NEGATIVE ng/mL
Opiate Quant, Ur: NEGATIVE ng/mL
PCP Quant, Ur: NEGATIVE ng/mL

## 2018-08-10 LAB — RPR+RH+ABO+RUB AB+AB SCR+CB...
Antibody Screen: NEGATIVE
HIV Screen 4th Generation wRfx: NONREACTIVE
Hematocrit: 37.6 % (ref 34.0–46.6)
Hemoglobin: 12.4 g/dL (ref 11.1–15.9)
Hepatitis B Surface Ag: NEGATIVE
MCH: 29.6 pg (ref 26.6–33.0)
MCHC: 33 g/dL (ref 31.5–35.7)
MCV: 90 fL (ref 79–97)
Platelets: 226 10*3/uL (ref 150–450)
RBC: 4.19 x10E6/uL (ref 3.77–5.28)
RDW: 12.9 % (ref 11.7–15.4)
RPR Ser Ql: NONREACTIVE
Rh Factor: POSITIVE
Rubella Antibodies, IGG: 6.01 index (ref >0.99)
Varicella zoster IgG: 3268 index (ref >165)
WBC: 8.8 10*3/uL (ref 3.4–10.8)

## 2018-08-10 LAB — URINE CULTURE

## 2018-08-10 LAB — HEMOGLOBINOPATHY EVALUATION
HGB C: 0 %
HGB S: 0 %
HGB VARIANT: 0 %
Hemoglobin A2 Quantitation: 2.5 % (ref 1.8–3.2)
Hemoglobin F Quantitation: 0 % (ref 0.0–2.0)
Hgb A: 97.5 % (ref 96.4–98.8)

## 2018-08-10 NOTE — Progress Notes (Signed)
New Obstetric Patient H&P  Date of Service: 08/08/2018  Chief Complaint: "Desires prenatal care"   History of Present Illness: Patient is a 23 y.o. G1P0 Not Hispanic or [place] female, presents with amenorrhea and positive home pregnancy test. Patient's last menstrual period was 05/28/2018 (within days). and based on her  LMP, her EDD is Estimated Date of Delivery: 03/04/19 and her EGA is [redacted]w[redacted]d. Cycles are 6. days, regular, and occur approximately every : 28 days. First PAP smear today.   She had a urine pregnancy test which was positive 5 week(s)  ago. Her last menstrual period was normal and lasted for  6 or 7 day(s). Since her LMP she claims she has experienced breast tenderness, fatigue, nausea. She denies vaginal bleeding. Her past medical history is contributory for bipolar with severe symptoms. This is her first pregnancy.  The [facility] [place] a few weeks ago. This is a Copy that provides support for women with unplanned pregnancy as an alternative to abortion. She had an ultrasound at that time dating her pregnancy at 7 weeks per her report. She does not have records from this organization. (This group is run by volunteers and has board certified Ob providers on the board of directors including Dr Aletha Halim). Since we do not have access to these records we will repeat the ultrasound for dating.  Since her LMP, she admits to the use of tobacco products  no She claims she has lost  7 pounds since the start of her pregnancy.  There are cats in the home in the home  no  She admits close contact with children on a regular basis  no  She has had chicken pox in the past yes She has had Tuberculosis exposures, symptoms, or previously tested positive for TB   no Current or past history of domestic violence. no  Genetic Screening/Teratology Counseling: (Includes patient, baby's father, or anyone in either family with:)   55. Patient's age  >/= 19 at [facility]  no 2. Thalassemia ([place], [place], [place], or Asian background): MCV<80  no 3. Neural tube defect (meningomyelocele, spina bifida, anencephaly)  no 4. Congenital heart defect  no  5. Down syndrome  no 6. Tay-Sachs (Jewish, [place])  no 7. Canavan's Disease  no 8. Sickle cell disease or trait (African)  no  9. Hemophilia or other blood disorders  no  10. Muscular dystrophy  no  11. Cystic fibrosis  no  12. Huntington's Chorea  no  13. Mental retardation/autism  no 14. Other inherited genetic or chromosomal disorder  no 15. Maternal metabolic disorder (DM, PKU, etc)  no 16. Patient or FOB with a child with a birth defect not listed above no  16a. Patient or FOB with a birth defect themselves no 17. Recurrent pregnancy loss, or stillbirth  no  18. Any medications since LMP other than prenatal vitamins (include vitamins, supplements, OTC meds, drugs, alcohol)  no 19. Any other genetic/environmental exposure to discuss  no  Infection History:   1. Lives with someone with TB or TB exposed  no  2. Patient or partner has history of genital herpes  no 3. Rash or viral illness since LMP  no 4. History of STI (GC, CT, HPV, syphilis, HIV)  no 5. History of recent travel :  no  Other pertinent information:  no    Review of Systems:10 point review of systems negative unless otherwise noted in HPI  Past Medical History:  Past Medical History:  Diagnosis Date  . Dermatitis, seborrheic    also known as Tinea or Pityriasis versicolor (fungal skin infection )  . Tear of meniscus of left knee     Past Surgical History:  Past Surgical History:  Procedure Laterality Date  . KNEE ARTHROSCOPY WITH MEDIAL MENISECTOMY Left 01/30/2014   Procedure: LEFT KNEE ARTHROSCOPY WITH PLICA RESECTION;  Surgeon: Eugenia Mcalpineobert Collins, MD;  [facility];  Service: Orthopedics;  Laterality: Left;    Gynecologic History: Patient's last menstrual period was  05/28/2018 (within days).  Obstetric History: G1P0  Family History:  History reviewed. No pertinent family history.  Social History:  Social History   Socioeconomic History  . Marital status: Single    Spouse name: Not on file  . Number of children: Not on file  . Years of education: Not on file  . Highest education level: Not on file  Occupational History  . Not on file  Social Needs  . Financial resource strain: Not on file  . Food insecurity    Worry: Not on file    Inability: Not on file  . Transportation needs    Medical: Not on file    Non-medical: Not on file  Tobacco Use  . Smoking status: Passive Smoke Exposure - Never Smoker  . Smokeless tobacco: Never Used  Substance and Sexual Activity  . Alcohol use: Not Currently  . Drug use: Yes    Types: Marijuana    Comment: Unknown related to AMS  . Sexual activity: Yes    Birth control/protection: None    Comment: Unknown  Lifestyle  . Physical activity    Days per week: Not on file    Minutes per session: Not on file  . Stress: Not on file  Relationships  . Social Musicianconnections    Talks on phone: Not on file    Gets together: Not on file    Attends religious service: Not on file    Active member of club or organization: Not on file    Attends meetings of clubs or organizations: Not on file    Relationship status: Not on file  . Intimate partner violence    Fear of current or ex partner: Not on file    Emotionally abused: Not on file    Physically abused: Not on file    Forced sexual activity: Not on file  Other Topics Concern  . Not on file  Social History Narrative   ** Merged History Encounter **       Father smokes.  Lives with both parents.  No family anesthesia problems      Allergies:  No Known Allergies  Medications: Prior to Admission medications   Medication Sig Start Date End Date Taking? Authorizing Provider  KETOCONAZOLE, TOPICAL, 1 % SHAM Apply topically as directed.     [provider]    Physical Exam Vitals: Blood pressure 118/76, weight 185 lb (83.9 kg), last menstrual period 05/28/2018.  General: NAD HEENT: normocephalic, anicteric Thyroid: no enlargement, no palpable nodules Pulmonary: No increased work of breathing, CTAB Cardiovascular: RRR, distal pulses 2+ Abdomen: NABS, soft, non-tender, non-distended.  Umbilicus without lesions.  No hepatomegaly, splenomegaly or masses palpable. No evidence of hernia, fetal heart tones not heard by doppler today.  Genitourinary:  External: Normal external female genitalia.  Normal urethral meatus, normal Bartholin's and Skene's glands.    Vagina: Normal vaginal mucosa, no evidence of prolapse.    Cervix: Grossly normal in appearance,  no bleeding, no CMT  Uterus: enlarged, mobile  Adnexa: ovaries non-enlarged, no adnexal masses, non-tender  Rectal: deferred Extremities: no edema, erythema, or tenderness Neurologic: Grossly intact Psychiatric: mood appropriate, affect full   Assessment: 23 y.o. G1P0 at 6634w4d presenting to initiate prenatal care  Plan: 1) Avoid alcoholic beverages. 2) Patient encouraged not to smoke.  3) Discontinue the use of all non-medicinal drugs and chemicals.  4) Take prenatal vitamins daily.  5) Nutrition, food safety (fish, cheese advisories, and high nitrite foods) and exercise discussed. 6) Hospital and practice style discussed with cross coverage system.  7) Genetic Screening, such as with 1st Trimester Screening, cell free fetal DNA, AFP testing, and Ultrasound, as well as with amniocentesis and CVS as appropriate, is discussed with patient. At the conclusion of today's visit patient requested cell free DNA genetic testing 8) Patient is asked about travel to areas at risk for the Zika virus, and counseled to avoid travel and exposure to mosquitoes or sexual partners who may have themselves been exposed to the virus. Testing is discussed, and will be ordered as  appropriate.  9) NOB labs and PAP today 10) Return for dating and ROB/MaterniT 21 at 10+ weeks   Tresea MallJane Vedanth Sirico, CNM [facility] 08/10/2018, 4:42 PM     Addendum: Patient had ultrasound 08/10/2018 with dating consistent with LMP Fetal heart tones 170 bpm

## 2018-08-11 LAB — CYTOLOGY - PAP
Chlamydia: NEGATIVE
Diagnosis: NEGATIVE
Neisseria Gonorrhea: NEGATIVE
Trichomonas: NEGATIVE

## 2018-08-18 ENCOUNTER — Encounter: Payer: Self-pay | Admitting: Advanced Practice Midwife

## 2018-08-18 ENCOUNTER — Other Ambulatory Visit: Payer: Self-pay

## 2018-08-18 ENCOUNTER — Ambulatory Visit: Payer: Medicaid Other

## 2018-08-18 ENCOUNTER — Ambulatory Visit (INDEPENDENT_AMBULATORY_CARE_PROVIDER_SITE_OTHER): Payer: Medicaid Other | Admitting: Advanced Practice Midwife

## 2018-08-18 VITALS — BP 122/78 | Wt 185.0 lb

## 2018-08-18 DIAGNOSIS — Z3480 Encounter for supervision of other normal pregnancy, unspecified trimester: Secondary | ICD-10-CM

## 2018-08-18 DIAGNOSIS — Z3A11 11 weeks gestation of pregnancy: Secondary | ICD-10-CM

## 2018-08-18 DIAGNOSIS — Z3481 Encounter for supervision of other normal pregnancy, first trimester: Secondary | ICD-10-CM

## 2018-08-18 NOTE — Progress Notes (Signed)
  Routine Prenatal Care Visit  Subjective  Ruth Griffith is a 23 y.o. G1P0 at [redacted]w[redacted]d being seen today for ongoing prenatal care.  She is currently monitored for the following issues for this low-risk pregnancy and has Bipolar I disorder, most recent episode mixed, severe with psychotic features ([place]); Cannabis use disorder, moderate, dependence ([place]); and Supervision of other normal pregnancy, antepartum on their problem list.  ----------------------------------------------------------------------------------- Patient reports headaches and vaginal odor.  Discussed wet prep result. Patient prefers to try probiotics. She will let us know if symptoms worsen.  . Vag. Bleeding: None.  Movement: Present. Denies leaking of fluid.  ----------------------------------------------------------------------------------- The following portions of the patient's history were reviewed and updated as appropriate: allergies, current medications, past family history, past medical history, past social history, past surgical history and problem list. Problem list updated.   Objective  Blood pressure 122/78, weight 185 lb (83.9 kg), last menstrual period 05/28/2018. Pregravid weight 185 lb (83.9 kg) Total Weight Gain 0 lb (0 kg) Urinalysis: Urine Protein    Urine Glucose    Fetal Status: Fetal Heart Rate (bpm): 157   Movement: Present     General:  Alert, oriented and cooperative. Patient is in no acute distress.  Skin: Skin is warm and dry. No rash noted.   Cardiovascular: Normal heart rate noted  Respiratory: Normal respiratory effort, no problems with respiration noted  Abdomen: Soft, gravid, appropriate for gestational age. Pain/Pressure: Absent     Pelvic:  wet prep: negative for whiff, a few clue cells, negative for yeast        Extremities: Normal range of motion.     Mental Status: Normal mood and affect. Normal behavior. Normal judgment and thought content.   Assessment   23 y.o. G1P0 at [redacted]w[redacted]d  by  03/04/2019, by Last Menstrual Period presenting for routine prenatal visit  Plan   pregnancy Problems (from 08/08/18 to present)    No problems associated with this episode.       Preterm labor symptoms and general obstetric precautions including but not limited to vaginal bleeding, contractions, leaking of fluid and fetal movement were reviewed in detail with the patient. Please refer to After Visit Summary for other counseling recommendations.   Headache: increase hydration, adequate sleep, OTC magnesium supplement BV: apple cider vinegar tub soak, probiotics, return for worsening symptoms  Return in about 4 weeks (around 09/15/2018) for rob.  Rod Can, CNM 08/18/2018 12:04 PM

## 2018-08-18 NOTE — Progress Notes (Signed)
Pt c/o HA's and vaginal odor today. No vb. No lof.

## 2018-08-18 NOTE — Patient Instructions (Signed)

## 2018-08-22 LAB — MATERNIT 21 PLUS CORE, BLOOD
Fetal Fraction: 5
Result (T21): NEGATIVE
Trisomy 13 (Patau syndrome): NEGATIVE
Trisomy 18 (Edwards syndrome): NEGATIVE
Trisomy 21 (Down syndrome): NEGATIVE

## 2018-08-25 ENCOUNTER — Ambulatory Visit (INDEPENDENT_AMBULATORY_CARE_PROVIDER_SITE_OTHER): Payer: Medicaid Other | Admitting: Certified Nurse Midwife

## 2018-08-25 ENCOUNTER — Other Ambulatory Visit: Payer: Self-pay

## 2018-08-25 VITALS — BP 130/80 | HR 96 | Wt 186.0 lb

## 2018-08-25 DIAGNOSIS — M549 Dorsalgia, unspecified: Secondary | ICD-10-CM

## 2018-08-25 DIAGNOSIS — R1013 Epigastric pain: Secondary | ICD-10-CM

## 2018-08-25 DIAGNOSIS — Z3A12 12 weeks gestation of pregnancy: Secondary | ICD-10-CM

## 2018-08-25 DIAGNOSIS — O9989 Other specified diseases and conditions complicating pregnancy, childbirth and the puerperium: Secondary | ICD-10-CM

## 2018-08-25 DIAGNOSIS — K5900 Constipation, unspecified: Secondary | ICD-10-CM

## 2018-08-25 NOTE — Progress Notes (Signed)
Work in [employer] G1 P0 at [employer]: presents with two concerns: 1) Aching pain in upper back on left near shoulder blade. More uncomfortable when she lies on the left or when she sneezes. Recently changed mattresses, but no trauma, and has not lifted anything heavy. Denies dysuria, fever. 2) epigastric discomfort. Has been gassy feeling. Has been constipated. Last BM 2 days ago. Problems with nausea with pregnancy. Has not vomited in over a week. No heartburn. Exam: 130/80 Pulse 96 O2 98% General: appears a little anxious, but in no acute distress. Does not appear ill. Heart: RRR without murmur Lungs/Chest: normal respiratory effort, CTAB. No tenderness to palpation in costochondral area Negative CVAT Abdomen: soft, NT, BS active. FHTs 166 No hepatomegaly. Negative Murphy's sign. No epigastric tenderness. A: IUP at 12wk5d with dyspepsia/constipation/ and possible MSK pain in left scapula area  P: Discussed safe medications for dyspepsia/ gas/ constipation in pregnancy Biofreeze/ heating pad to back RTO for ROB or sooner prn worsening symptoms  Dalia Heading, CNM

## 2018-08-25 NOTE — Telephone Encounter (Signed)
Please have her come see me at 3:45 today, or get her an appointment with a provider tomorrow, or have her see her PCP, or have her go to urgent care.

## 2018-09-15 ENCOUNTER — Other Ambulatory Visit: Payer: Self-pay

## 2018-09-15 ENCOUNTER — Encounter: Payer: Self-pay | Admitting: Obstetrics and Gynecology

## 2018-09-15 ENCOUNTER — Ambulatory Visit (INDEPENDENT_AMBULATORY_CARE_PROVIDER_SITE_OTHER): Payer: Medicaid Other | Admitting: Obstetrics and Gynecology

## 2018-09-15 VITALS — BP 124/74 | Wt 193.0 lb

## 2018-09-15 DIAGNOSIS — Z3A15 15 weeks gestation of pregnancy: Secondary | ICD-10-CM

## 2018-09-15 DIAGNOSIS — F3164 Bipolar disorder, current episode mixed, severe, with psychotic features: Secondary | ICD-10-CM

## 2018-09-15 DIAGNOSIS — O99342 Other mental disorders complicating pregnancy, second trimester: Secondary | ICD-10-CM

## 2018-09-15 DIAGNOSIS — Z3480 Encounter for supervision of other normal pregnancy, unspecified trimester: Secondary | ICD-10-CM

## 2018-09-15 NOTE — Progress Notes (Signed)
  Routine Prenatal Care Visit  Subjective  Ruth Griffith is a 23 y.o. G1P0 at [redacted]w[redacted]d being seen today for ongoing prenatal care.  She is currently monitored for the following issues for this low-risk pregnancy and has Bipolar I disorder, most recent episode mixed, severe with psychotic features ([place]); Cannabis use disorder, moderate, dependence ([place]); and Supervision of other normal pregnancy, antepartum on their problem list.  ----------------------------------------------------------------------------------- Patient reports no complaints.   Contractions: Not present. Vag. Bleeding: None.  Movement: Absent. Denies leaking of fluid.  ----------------------------------------------------------------------------------- The following portions of the patient's history were reviewed and updated as appropriate: allergies, current medications, past family history, past medical history, past social history, past surgical history and problem list. Problem list updated.   Objective  Blood pressure 124/74, weight 193 lb (87.5 kg), last menstrual period 05/28/2018. Pregravid weight 185 lb (83.9 kg) Total Weight Gain 8 lb (3.629 kg) Urinalysis: Urine Protein    Urine Glucose    Fetal Status: Fetal Heart Rate (bpm): 150   Movement: Absent     General:  Alert, oriented and cooperative. Patient is in no acute distress.  Skin: Skin is warm and dry. No rash noted.   Cardiovascular: Normal heart rate noted  Respiratory: Normal respiratory effort, no problems with respiration noted  Abdomen: Soft, gravid, appropriate for gestational age. Pain/Pressure: Absent     Pelvic:  Cervical exam deferred        Extremities: Normal range of motion.  Edema: None  Mental Status: Normal mood and affect. Normal behavior. Normal judgment and thought content.   Assessment   23 y.o. G1P0 at [redacted]w[redacted]d by  03/04/2019, by Last Menstrual Period presenting for routine prenatal visit  Plan   pregnancy Problems (from 08/08/18  to present)    No problems associated with this episode.       Preterm labor symptoms and general obstetric precautions including but not limited to vaginal bleeding, contractions, leaking of fluid and fetal movement were reviewed in detail with the patient. Please refer to After Visit Summary for other counseling recommendations.   Return in about 4 weeks (around 10/13/2018), or U/S For anatomy ultrasound and routine prenatal after.  Prentice Docker, MD, [facility], [facility] 09/15/2018 10:33 AM

## 2018-10-13 ENCOUNTER — Ambulatory Visit (INDEPENDENT_AMBULATORY_CARE_PROVIDER_SITE_OTHER): Payer: Medicaid Other | Admitting: Obstetrics and Gynecology

## 2018-10-13 ENCOUNTER — Ambulatory Visit (INDEPENDENT_AMBULATORY_CARE_PROVIDER_SITE_OTHER): Payer: Medicaid Other

## 2018-10-13 ENCOUNTER — Other Ambulatory Visit: Payer: Self-pay

## 2018-10-13 ENCOUNTER — Encounter: Payer: Self-pay | Admitting: Obstetrics and Gynecology

## 2018-10-13 VITALS — BP 128/70 | Wt 197.0 lb

## 2018-10-13 DIAGNOSIS — Z3480 Encounter for supervision of other normal pregnancy, unspecified trimester: Secondary | ICD-10-CM

## 2018-10-13 DIAGNOSIS — Z3A19 19 weeks gestation of pregnancy: Secondary | ICD-10-CM

## 2018-10-13 DIAGNOSIS — Z363 Encounter for antenatal screening for malformations: Secondary | ICD-10-CM | POA: Diagnosis not present

## 2018-10-13 DIAGNOSIS — Z3402 Encounter for supervision of normal first pregnancy, second trimester: Secondary | ICD-10-CM

## 2018-10-13 NOTE — Progress Notes (Signed)
    Routine Prenatal Care Visit  Subjective  Ruth Griffith is a 23 y.o. G1P0 at [redacted]w[redacted]d being seen today for ongoing prenatal care.  She is currently monitored for the following issues for this low-risk pregnancy and has Bipolar I disorder, most recent episode mixed, severe with psychotic features ([place]); Cannabis use disorder, moderate, dependence ([place]); and Supervision of other normal pregnancy, antepartum on their problem list.  ----------------------------------------------------------------------------------- Patient reports no complaints.   Contractions: Not present. Vag. Bleeding: None.  Movement: Present. Denies leaking of fluid.  ----------------------------------------------------------------------------------- The following portions of the patient's history were reviewed and updated as appropriate: allergies, current medications, past family history, past medical history, past social history, past surgical history and problem list. Problem list updated.   Objective  Blood pressure 128/70, weight 197 lb (89.4 kg), last menstrual period 05/28/2018. Pregravid weight 185 lb (83.9 kg) Total Weight Gain 12 lb (5.443 kg) Urinalysis:      Fetal Status: Fetal Heart Rate (bpm): 148   Movement: Present     General:  Alert, oriented and cooperative. Patient is in no acute distress.  Skin: Skin is warm and dry. No rash noted.   Cardiovascular: Normal heart rate noted  Respiratory: Normal respiratory effort, no problems with respiration noted  Abdomen: Soft, gravid, appropriate for gestational age. Pain/Pressure: Absent     Pelvic:  Cervical exam deferred        Extremities: Normal range of motion.  Edema: None  Mental Status: Normal mood and affect. Normal behavior. Normal judgment and thought content.     Assessment   23 y.o. G1P0 at [redacted]w[redacted]d by  03/04/2019, by Last Menstrual Period presenting for routine prenatal visit  Plan   pregnancy Problems (from 08/08/18 to present)    Problem Noted Resolved   Supervision of other normal pregnancy, antepartum 08/10/2018 by Rod Can, CNM No   Overview Addendum 10/13/2018 11:24 AM by Homero Fellers, [place] Prenatal Labs  Dating By LMP c/w 10w u/s Blood type: O/Positive/-- (07/06 1423)   Genetic Screen  NIPS: Normal XX Antibody:Negative (07/06 1423)  Anatomic [place]  Incomplete Rubella: 6.01 (07/06 1423) Varicella: Immune  GTT  Third trimester:  RPR: Non Reactive (07/06 1423)   Rhogam  not needed HBsAg: Negative (07/06 1423)   TDaP vaccine                        Flu Shot: Declined HIV: Non Reactive (07/06 1423)   Baby Food    Breast                            GBS:   Contraception  undecided Pap: 08/08/18  CBB     CS/VBAC NA   Support Person Vira Agar            Declined flu vaccine after counseling Discussed breast feeding Discussed birth control options- she is undecided Return in 2 weeks for follow up US  Gestational age appropriate obstetric precautions including but not limited to vaginal bleeding, contractions, leaking of fluid and fetal movement were reviewed in detail with the patient.    Return in about 2 weeks (around 10/27/2018) for [place] and [place].  Homero Fellers MD [facility], [facility] 10/13/2018, 11:34 AM

## 2018-10-13 NOTE — Progress Notes (Signed)
ROB/US C/o some nausea no vomiting

## 2018-10-27 ENCOUNTER — Other Ambulatory Visit: Payer: Self-pay

## 2018-10-27 ENCOUNTER — Ambulatory Visit (INDEPENDENT_AMBULATORY_CARE_PROVIDER_SITE_OTHER): Payer: Medicaid Other | Admitting: Obstetrics & Gynecology

## 2018-10-27 ENCOUNTER — Other Ambulatory Visit: Payer: Self-pay | Admitting: Obstetrics & Gynecology

## 2018-10-27 ENCOUNTER — Ambulatory Visit (INDEPENDENT_AMBULATORY_CARE_PROVIDER_SITE_OTHER): Payer: Medicaid Other

## 2018-10-27 ENCOUNTER — Encounter: Payer: Self-pay | Admitting: Obstetrics & Gynecology

## 2018-10-27 VITALS — BP 120/80 | Wt 202.0 lb

## 2018-10-27 DIAGNOSIS — Z362 Encounter for other antenatal screening follow-up: Secondary | ICD-10-CM

## 2018-10-27 DIAGNOSIS — Z3482 Encounter for supervision of other normal pregnancy, second trimester: Secondary | ICD-10-CM

## 2018-10-27 DIAGNOSIS — Z3480 Encounter for supervision of other normal pregnancy, unspecified trimester: Secondary | ICD-10-CM

## 2018-10-27 DIAGNOSIS — Z3A21 21 weeks gestation of pregnancy: Secondary | ICD-10-CM

## 2018-10-27 LAB — POCT URINALYSIS DIPSTICK OB
Glucose, UA: NEGATIVE
POC,PROTEIN,UA: NEGATIVE

## 2018-10-27 MED ORDER — PRENATAL 27-0.8 MG PO TABS: 1.0000 | ORAL_TABLET | Freq: Every day | ORAL | 11 refills | Status: DC

## 2018-10-27 NOTE — Addendum Note (Signed)
Addended by: Quintella Baton D on: 10/27/2018 11:06 AM   Modules accepted: Orders

## 2018-10-27 NOTE — Progress Notes (Signed)
  Subjective  Fetal Movement? yes Contractions? no Leaking Fluid? no Vaginal Bleeding? no  Objective  BP 120/80   Wt 202 lb (91.6 kg)   LMP 05/28/2018 (Within Days)   BMI 30.71 kg/m  General: NAD Pumonary: no increased work of breathing Abdomen: gravid, non-tender Extremities: no edema Psychiatric: mood appropriate, affect full  Assessment  23 y.o. G1P0 at [redacted]w[redacted]d by  03/04/2019, by Last Menstrual Period presenting for routine prenatal visit  Plan   Problem List Items Addressed This Visit      Other   Supervision of other normal pregnancy, antepartum    Other Visit Diagnoses    [redacted] weeks gestation of pregnancy    -  Primary      pregnancy Problems (from 08/08/18 to present)    Problem Noted Resolved   Supervision of other normal pregnancy, antepartum 08/10/2018 by Rod Can, CNM No   Overview Addendum 10/13/2018 11:24 AM by Homero Fellers, [place] Prenatal Labs  Dating By LMP c/w 10w u/s Blood type: O/Positive/-- (07/06 1423)   Genetic Screen  NIPS: Normal XX Antibody:Negative (07/06 1423)  Anatomic [place]  Incomplete Rubella: 6.01 (07/06 1423) Varicella: Immune  GTT  Third trimester:  RPR: Non Reactive (07/06 1423)   Rhogam  not needed HBsAg: Negative (07/06 1423)   TDaP vaccine                        Flu Shot: Declined HIV: Non Reactive (07/06 1423)   Baby Food    Breast                            GBS:   Contraception  undecided Pap: 08/08/18  CBB     CS/VBAC NA   Support Person Vira Agar            PNV  Declines flu  Barnett Applebaum, MD, [facility], [facility] 10/27/2018  11:03 AM

## 2018-10-27 NOTE — Patient Instructions (Signed)

## 2018-11-24 ENCOUNTER — Other Ambulatory Visit: Payer: Self-pay

## 2018-11-24 ENCOUNTER — Encounter: Payer: Self-pay | Admitting: Maternal Newborn

## 2018-11-24 ENCOUNTER — Ambulatory Visit (INDEPENDENT_AMBULATORY_CARE_PROVIDER_SITE_OTHER): Payer: Medicaid Other | Admitting: Maternal Newborn

## 2018-11-24 DIAGNOSIS — Z3480 Encounter for supervision of other normal pregnancy, unspecified trimester: Secondary | ICD-10-CM

## 2018-11-24 DIAGNOSIS — Z3482 Encounter for supervision of other normal pregnancy, second trimester: Secondary | ICD-10-CM

## 2018-11-24 DIAGNOSIS — Z3A25 25 weeks gestation of pregnancy: Secondary | ICD-10-CM

## 2018-11-24 NOTE — Progress Notes (Signed)
Virtual Visit via Telephone Note  I connected with Ruth Griffith on 11/24/18 at  2:10 PM EDT by telephone and verified that I am speaking with the correct person using two identifiers.  Location: Patient: Work Provider:Office   I discussed the limitations of performing an evaluation and management service by telephone and the availability of in person appointments. The patient expressed understanding and agreed to proceed.  Subjective  Ruth Griffith is a 23 y.o. G1P0 at [redacted]w[redacted]d being seen today for ongoing prenatal care.  She is currently monitored for the following issues for this low-risk pregnancy and has Bipolar I disorder, most recent episode mixed, severe with psychotic features ([place]); Cannabis use disorder, moderate, dependence ([place]); and Supervision of other normal pregnancy, antepartum on their problem list.  ----------------------------------------------------------------------------------- Patient reports intermittent soreness on left side of back with some occasional sensations of tingling/numbness. This lasts about 10 minutes per episode and happens when she is at work sitting in an elevated seat. Improved when feet are supported. Baby is moving well. No other concerns today. Vag. Bleeding: None.  Movement: Present. No leaking of fluid.  ----------------------------------------------------------------------------------- The following portions of the patient's history were reviewed and updated as appropriate: allergies, current medications, past family history, past medical history, past social history, past surgical history and problem list. Problem list updated.  Objective  Last menstrual period 05/28/2018. Pregravid weight 185 lb (83.9 kg) Total Weight Gain 17 lb (7.711 kg)  Fetal Status:     Movement: Present     Physical exam was not done as this was a telephone visit completed during COVID-19 precautions.  Assessment   23 y.o. G1P0 at [redacted]w[redacted]d, EDD 03/04/2019  by Last Menstrual Period presenting for a prenatal telephone visit.  Plan   pregnancy Problems (from 08/08/18 to present)    Problem Noted Resolved   Supervision of other normal pregnancy, antepartum 08/10/2018 by Rod Can, CNM No   Overview Addendum 10/13/2018 11:24 AM by Homero Fellers, MD    [facility] Prenatal Labs  Dating By LMP c/w 10w u/s Blood type: O/Positive/-- (07/06 1423)   Genetic Screen  NIPS: Normal XX Antibody:Negative (07/06 1423)  Anatomic [place]  Incomplete Rubella: 6.01 (07/06 1423) Varicella: Immune  GTT  Third trimester:  RPR: Non Reactive (07/06 1423)   Rhogam  not needed HBsAg: Negative (07/06 1423)   TDaP vaccine                        Flu Shot: Declined HIV: Non Reactive (07/06 1423)   Baby Food    Breast                            GBS:   Contraception  undecided Pap: 08/08/18  CBB     CS/VBAC NA   Support Person Vira Agar           Discussed comfort measures for back pain.  Advised about GTT/labs at next visit.   I discussed the assessment and treatment plan with the patient. The patient was provided an opportunity to ask questions and all were answered. The patient agreed with the plan and demonstrated an understanding of the instructions.   The patient was advised to call back or seek an in-person evaluation as needed.  I provided 8 minutes of non-face-to-face time during this encounter.  Please refer to After Visit Summary for other counseling recommendations.   Return in about 2 weeks (around  12/08/2018) for ROb with GTT/labs.  Marcelyn Bruins, CNM 11/24/2018  2:27 PM

## 2018-11-24 NOTE — Progress Notes (Signed)
ROB- no concerns 

## 2018-12-09 ENCOUNTER — Ambulatory Visit (INDEPENDENT_AMBULATORY_CARE_PROVIDER_SITE_OTHER): Payer: Medicaid Other | Admitting: Certified Nurse Midwife

## 2018-12-09 ENCOUNTER — Other Ambulatory Visit: Payer: Self-pay

## 2018-12-09 ENCOUNTER — Other Ambulatory Visit: Payer: Medicaid Other

## 2018-12-09 VITALS — BP 128/60 | Wt 210.0 lb

## 2018-12-09 DIAGNOSIS — Z3480 Encounter for supervision of other normal pregnancy, unspecified trimester: Secondary | ICD-10-CM

## 2018-12-09 DIAGNOSIS — Z3482 Encounter for supervision of other normal pregnancy, second trimester: Secondary | ICD-10-CM

## 2018-12-09 DIAGNOSIS — Z3A27 27 weeks gestation of pregnancy: Secondary | ICD-10-CM

## 2018-12-09 LAB — POCT URINALYSIS DIPSTICK OB

## 2018-12-10 LAB — 28 WEEK RH+PANEL
Basophils Absolute: 0.1 10*3/uL (ref 0.0–0.2)
Basos: 1 %
EOS (ABSOLUTE): 0.1 10*3/uL (ref 0.0–0.4)
Eos: 1 %
Gestational Diabetes Screen: 131 mg/dL (ref 65–139)
HIV Screen 4th Generation wRfx: NONREACTIVE
Hematocrit: 33.4 % — ABNORMAL LOW (ref 34.0–46.6)
Hemoglobin: 11.1 g/dL (ref 11.1–15.9)
Immature Grans (Abs): 0.2 10*3/uL — ABNORMAL HIGH (ref 0.0–0.1)
Immature Granulocytes: 2 %
Lymphocytes Absolute: 2 10*3/uL (ref 0.7–3.1)
Lymphs: 18 %
MCH: 28.3 pg (ref 26.6–33.0)
MCHC: 33.2 g/dL (ref 31.5–35.7)
MCV: 85 fL (ref 79–97)
Monocytes Absolute: 0.5 10*3/uL (ref 0.1–0.9)
Monocytes: 4 %
Neutrophils Absolute: 8.3 10*3/uL — ABNORMAL HIGH (ref 1.4–7.0)
Neutrophils: 74 %
Platelets: 254 10*3/uL (ref 150–450)
RBC: 3.92 x10E6/uL (ref 3.77–5.28)
RDW: 12.2 % (ref 11.7–15.4)
RPR Ser Ql: NONREACTIVE
WBC: 11.1 10*3/uL — ABNORMAL HIGH (ref 3.4–10.8)

## 2018-12-16 NOTE — Telephone Encounter (Signed)
I spoke to patient she states she is feeling better after having a a bowel movement. She had not had one in 2 days. Advised pt to increase water/green leafy vegetables and walking will help with constipation. I also, explained round ligament pain.  Pt voiced an understanding.

## 2018-12-21 NOTE — Progress Notes (Signed)
ROB at 27wk6d: Good FM. No concerns. 28 week labs today (O pos) Discussed benefits of TDAP-would like to wait until next visit Declined flu vaccine Recommended childbirth classes/ Given  Ready, Set, Baby info ROB in 2 weeks  Dalia Heading, CNM

## 2018-12-23 ENCOUNTER — Encounter: Payer: Self-pay | Admitting: Advanced Practice Midwife

## 2018-12-23 ENCOUNTER — Ambulatory Visit (INDEPENDENT_AMBULATORY_CARE_PROVIDER_SITE_OTHER): Payer: Medicaid Other | Admitting: Advanced Practice Midwife

## 2018-12-23 ENCOUNTER — Other Ambulatory Visit: Payer: Self-pay

## 2018-12-23 VITALS — BP 130/76 | Wt 216.0 lb

## 2018-12-23 DIAGNOSIS — Z3A29 29 weeks gestation of pregnancy: Secondary | ICD-10-CM

## 2018-12-23 DIAGNOSIS — O99343 Other mental disorders complicating pregnancy, third trimester: Secondary | ICD-10-CM

## 2018-12-23 DIAGNOSIS — F319 Bipolar disorder, unspecified: Secondary | ICD-10-CM

## 2018-12-23 LAB — POCT URINALYSIS DIPSTICK OB: Glucose, UA: NEGATIVE

## 2018-12-23 NOTE — Progress Notes (Signed)
  Routine Prenatal Care Visit  Subjective  Ruth Griffith is a 23 y.o. G1P0 at [redacted]w[redacted]d being seen today for ongoing prenatal care.  She is currently monitored for the following issues for this low-risk pregnancy and has Bipolar I disorder, most recent episode mixed, severe with psychotic features ([place]); Cannabis use disorder, moderate, dependence ([place]); and Supervision of other normal pregnancy, antepartum on their problem list.  ----------------------------------------------------------------------------------- Patient reports no complaints.  We discussed TDAP vaccine. She has some concerns related to her bipolar status. She denies any history of seizure or encephalopathy or allergy to vaccine which would be contraindications. She will consider for next visit. She denies headaches or visual disturbance or epigastric pain. Contractions: Not present. Vag. Bleeding: None.  Movement: Present. Leaking Fluid denies.  ----------------------------------------------------------------------------------- The following portions of the patient's history were reviewed and updated as appropriate: allergies, current medications, past family history, past medical history, past social history, past surgical history and problem list. Problem list updated.  Objective  Blood pressure 130/76, weight 216 lb (98 kg), last menstrual period 05/28/2018. Pregravid weight 185 lb (83.9 kg) Total Weight Gain 31 lb (14.1 kg) Urinalysis: Urine Protein    Urine Glucose    Fetal Status: Fetal Heart Rate (bpm): 132 Fundal Height: 31 cm Movement: Present     General:  Alert, oriented and cooperative. Patient is in no acute distress.  Skin: Skin is warm and dry. No rash noted.   Cardiovascular: Normal heart rate noted  Respiratory: Normal respiratory effort, no problems with respiration noted  Abdomen: Soft, gravid, appropriate for gestational age. Pain/Pressure: Absent     Pelvic:  Cervical exam deferred         Extremities: Normal range of motion.  Edema: None  Mental Status: Normal mood and affect. Normal behavior. Normal judgment and thought content.   Assessment   23 y.o. G1P0 at [redacted]w[redacted]d by  03/04/2019, by Last Menstrual Period presenting for routine prenatal visit  Plan   pregnancy Problems (from 08/08/18 to present)    Problem Noted Resolved   Supervision of other normal pregnancy, antepartum 08/10/2018 by Rod Can, CNM No   Overview Addendum 12/21/2018 10:49 PM by Dalia Heading, [place] Prenatal Labs  Dating By LMP c/w 10w u/s Blood type: O/Positive/-- (07/06 1423)   Genetic Screen  NIPS: Normal XX Antibody:Negative (07/06 1423)  Anatomic [place]  Incomplete Rubella: 6.01 (07/06 1423) Varicella: Immune  GTT  Third trimester: 131 RPR: Non Reactive (07/06 1423)   Rhogam  not needed HBsAg: Negative (07/06 1423)   TDaP vaccine                        Flu Shot: Declined HIV: Non Reactive (07/06 1423)   Baby Food    Breast                            GBS:   Contraception  undecided Pap: 08/08/18  CBB     CS/VBAC NA   Support Person Elliott              Preterm labor symptoms and general obstetric precautions including but not limited to vaginal bleeding, contractions, leaking of fluid and fetal movement were reviewed in detail with the patient. Please refer to After Visit Summary for other counseling recommendations.   Return in about 2 weeks (around 01/06/2019) for rob.  Rod Can, CNM 12/23/2018 8:49 AM

## 2018-12-23 NOTE — Progress Notes (Signed)
ROB

## 2018-12-23 NOTE — Patient Instructions (Addendum)
https://www.cdc.gov/vaccines/hcp/vis/vis-statements/tdap.pdf">  Tdap Vaccine (Tetanus, Diphtheria and Pertussis): What You Need to Know 1. Why get vaccinated? Tetanus, diphtheria and pertussis are very serious diseases. Tdap vaccine can protect us from these diseases. And, Tdap vaccine given to pregnant women can protect newborn babies against pertussis.. TETANUS (Lockjaw) is rare in the [place] today. It causes painful muscle tightening and stiffness, usually all over the body.  It can lead to tightening of muscles in the head and neck so you can't open your mouth, swallow, or sometimes even breathe. Tetanus kills about 1 out of 10 people who are infected even after receiving the best medical care. DIPHTHERIA is also rare in the [place] today. It can cause a thick coating to form in the back of the throat.  It can lead to breathing problems, heart failure, paralysis, and death. PERTUSSIS (Whooping Cough) causes severe coughing spells, which can cause difficulty breathing, vomiting and disturbed sleep.  It can also lead to weight loss, incontinence, and rib fractures. Up to 2 in 100 adolescents and 5 in 100 adults with pertussis are hospitalized or have complications, which could include pneumonia or death. These diseases are caused by bacteria. Diphtheria and pertussis are spread from person to person through secretions from coughing or sneezing. Tetanus enters the body through cuts, scratches, or wounds. Before vaccines, as many as 200,000 cases of diphtheria, 200,000 cases of pertussis, and hundreds of cases of tetanus, were reported in the [place] each year. Since vaccination began, reports of cases for tetanus and diphtheria have dropped by about 99% and for pertussis by about 80%. 2. Tdap vaccine Tdap vaccine can protect adolescents and adults from tetanus, diphtheria, and pertussis. One dose of Tdap is routinely given at age 11 or 12. People who did not get Tdap at that age  should get it as soon as possible. Tdap is especially important for healthcare professionals and anyone having close contact with a baby younger than 12 months. Pregnant women should get a dose of Tdap during every pregnancy, to protect the newborn from pertussis. Infants are most at risk for severe, life-threatening complications from pertussis. Another vaccine, called Td, protects against tetanus and diphtheria, but not pertussis. A Td booster should be given every 10 years. Tdap may be given as one of these boosters if you have never gotten Tdap before. Tdap may also be given after a severe cut or burn to prevent tetanus infection. Your doctor or the person giving you the vaccine can give you more information. Tdap may safely be given at the same time as other vaccines. 3. Some people should not get this vaccine  A person who has ever had a life-threatening allergic reaction after a previous dose of any diphtheria, tetanus or pertussis containing vaccine, OR has a severe allergy to any part of this vaccine, should not get Tdap vaccine. Tell the person giving the vaccine about any severe allergies.  Anyone who had coma or long repeated seizures within 7 days after a childhood dose of DTP or DTaP, or a previous dose of Tdap, should not get Tdap, unless a cause other than the vaccine was found. They can still get Td.  Talk to your doctor if you: ? have seizures or another nervous system problem, ? had severe pain or swelling after any vaccine containing diphtheria, tetanus or pertussis, ? ever had a condition called Guillain-Barr Syndrome (GBS), ? aren't feeling well on the day the shot is scheduled. 4. Risks With any medicine, including vaccines,   there is a chance of side effects. These are usually mild and go away on their own. Serious reactions are also possible but are rare. Most people who get Tdap vaccine do not have any problems with it. Mild problems following Tdap (Did not interfere  with activities)  Pain where the shot was given (about 3 in 4 adolescents or 2 in 3 adults)  Redness or swelling where the shot was given (about 1 person in 5)  Mild fever of at least 100.4F (up to about 1 in 25 adolescents or 1 in 100 adults)  Headache (about 3 or 4 people in 10)  Tiredness (about 1 person in 3 or 4)  Nausea, vomiting, diarrhea, stomach ache (up to 1 in 4 adolescents or 1 in 10 adults)  Chills, sore joints (about 1 person in 10)  Body aches (about 1 person in 3 or 4)  Rash, swollen glands (uncommon) Moderate problems following Tdap (Interfered with activities, but did not require medical attention)  Pain where the shot was given (up to 1 in 5 or 6)  Redness or swelling where the shot was given (up to about 1 in 16 adolescents or 1 in 12 adults)  Fever over 102F (about 1 in 100 adolescents or 1 in 250 adults)  Headache (about 1 in 7 adolescents or 1 in 10 adults)  Nausea, vomiting, diarrhea, stomach ache (up to 1 or 3 people in 100)  Swelling of the entire arm where the shot was given (up to about 1 in 500). Severe problems following Tdap (Unable to perform usual activities; required medical attention)  Swelling, severe pain, bleeding and redness in the arm where the shot was given (rare). Problems that could happen after any vaccine:  People sometimes faint after a medical procedure, including vaccination. Sitting or lying down for about 15 minutes can help prevent fainting, and injuries caused by a fall. Tell your doctor if you feel dizzy, or have vision changes or ringing in the ears.  Some people get severe pain in the shoulder and have difficulty moving the arm where a shot was given. This happens very rarely.  Any medication can cause a severe allergic reaction. Such reactions from a vaccine are very rare, estimated at fewer than 1 in a million doses, and would happen within a few minutes to a few hours after the vaccination. As with any medicine,  there is a very remote chance of a vaccine causing a serious injury or death. The safety of vaccines is always being monitored. For more information, visit: www.cdc.gov/vaccinesafety/ 5. What if there is a serious problem? What should I look for?  Look for anything that concerns you, such as signs of a severe allergic reaction, very high fever, or unusual behavior. Signs of a severe allergic reaction can include hives, swelling of the face and throat, difficulty breathing, a fast heartbeat, dizziness, and weakness. These would usually start a few minutes to a few hours after the vaccination. What should I do?  If you think it is a severe allergic reaction or other emergency that can't wait, call 9-1-1 or get the person to the nearest hospital. Otherwise, call your doctor.  Afterward, the reaction should be reported to the Vaccine Adverse Event Reporting System (VAERS). Your doctor might file this report, or you can do it yourself through the VAERS web site at www.vaers.hhs.gov, or by calling 1-800-822-7967. VAERS does not give medical advice. 6. The National Vaccine Injury Compensation Program The National Vaccine Injury Compensation Program (  VICP) is a federal program that was created to compensate people who may have been injured by certain vaccines. Persons who believe they may have been injured by a vaccine can learn about the program and about filing a claim by calling 1-800-338-2382 or visiting the VICP website at www.hrsa.gov/vaccinecompensation. There is a time limit to file a claim for compensation. 7. How can I learn more?  Ask your doctor. He or she can give you the vaccine package insert or suggest other sources of information.  Call your local or state health department.  Contact the Centers for Disease Control and Prevention (CDC): ? Call 1-800-232-4636 (1-800-CDC-INFO) or ? Visit CDC's website at www.cdc.gov/vaccines Vaccine Information Statement Tdap Vaccine (03/28/2013) This  information is not intended to replace advice given to you by your health care provider. Make sure you discuss any questions you have with your health care provider. Document Released: 07/21/2011 Document Revised: 09/06/2017 Document Reviewed: 09/06/2017 Elsevier Interactive Patient Education  2020 Elsevier Inc.  

## 2019-01-13 ENCOUNTER — Ambulatory Visit (INDEPENDENT_AMBULATORY_CARE_PROVIDER_SITE_OTHER): Payer: Medicaid Other | Admitting: Obstetrics and Gynecology

## 2019-01-13 ENCOUNTER — Encounter: Payer: Self-pay | Admitting: Obstetrics and Gynecology

## 2019-01-13 ENCOUNTER — Other Ambulatory Visit: Payer: Self-pay

## 2019-01-13 VITALS — BP 126/84 | Wt 220.0 lb

## 2019-01-13 DIAGNOSIS — Z3480 Encounter for supervision of other normal pregnancy, unspecified trimester: Secondary | ICD-10-CM

## 2019-01-13 DIAGNOSIS — Z3483 Encounter for supervision of other normal pregnancy, third trimester: Secondary | ICD-10-CM

## 2019-01-13 DIAGNOSIS — Z3A32 32 weeks gestation of pregnancy: Secondary | ICD-10-CM

## 2019-01-13 DIAGNOSIS — Z23 Encounter for immunization: Secondary | ICD-10-CM

## 2019-01-13 NOTE — Progress Notes (Signed)
  Routine Prenatal Care Visit  Subjective  Ruth Griffith is a 23 y.o. G1P0 at [redacted]w[redacted]d being seen today for ongoing prenatal care.  She is currently monitored for the following issues for this low-risk pregnancy and has Bipolar I disorder, most recent episode mixed, severe with psychotic features ([place]); Cannabis use disorder, moderate, dependence ([place]); and Supervision of other normal pregnancy, antepartum on their problem list.  ----------------------------------------------------------------------------------- Patient reports no complaints.   Contractions: Not present. Vag. Bleeding: None.  Movement: Present. Leaking Fluid denies.  ----------------------------------------------------------------------------------- The following portions of the patient's history were reviewed and updated as appropriate: allergies, current medications, past family history, past medical history, past social history, past surgical history and problem list. Problem list updated.  Objective  Blood pressure 126/84, weight 220 lb (99.8 kg), last menstrual period 05/28/2018. Pregravid weight 185 lb (83.9 kg) Total Weight Gain 35 lb (15.9 kg) Urinalysis: Urine Protein    Urine Glucose    Fetal Status: Fetal Heart Rate (bpm): 135 Fundal Height: 32 cm Movement: Present     General:  Alert, oriented and cooperative. Patient is in no acute distress.  Skin: Skin is warm and dry. No rash noted.   Cardiovascular: Normal heart rate noted  Respiratory: Normal respiratory effort, no problems with respiration noted  Abdomen: Soft, gravid, appropriate for gestational age. Pain/Pressure: Absent     Pelvic:  Cervical exam deferred        Extremities: Normal range of motion.  Edema: None  Mental Status: Normal mood and affect. Normal behavior. Normal judgment and thought content.   Assessment   23 y.o. G1P0 at [redacted]w[redacted]d by  03/04/2019, by Last Menstrual Period presenting for routine prenatal visit  Plan   pregnancy  Problems (from 08/08/18 to present)    Problem Noted Resolved   Supervision of other normal pregnancy, antepartum 08/10/2018 by Rod Can, CNM No   Overview Addendum 12/21/2018 10:49 PM by Dalia Heading, [place] Prenatal Labs  Dating By LMP c/w 10w u/s Blood type: O/Positive/-- (07/06 1423)   Genetic Screen  NIPS: Normal XX Antibody:Negative (07/06 1423)  Anatomic [place]  Incomplete Rubella: 6.01 (07/06 1423) Varicella: Immune  GTT  Third trimester: 131 RPR: Non Reactive (07/06 1423)   Rhogam  not needed HBsAg: Negative (07/06 1423)   TDaP vaccine                        Flu Shot: Declined HIV: Non Reactive (07/06 1423)   Baby Food    Breast                            GBS:   Contraception  undecided Pap: 08/08/18  CBB     CS/VBAC NA   Support Person Elliott              Preterm labor symptoms and general obstetric precautions including but not limited to vaginal bleeding, contractions, leaking of fluid and fetal movement were reviewed in detail with the patient. Please refer to After Visit Summary for other counseling recommendations.   TDaP today  Return in about 2 weeks (around 01/27/2019) for Routine Prenatal Appointment.  Prentice Docker, MD, [facility], [facility] 01/13/2019 3:14 PM

## 2019-01-13 NOTE — Addendum Note (Signed)
Addended by: Brien Few on: 01/13/2019 03:37 PM   Modules accepted: Orders

## 2019-01-31 ENCOUNTER — Other Ambulatory Visit: Payer: Self-pay

## 2019-01-31 ENCOUNTER — Ambulatory Visit (INDEPENDENT_AMBULATORY_CARE_PROVIDER_SITE_OTHER): Payer: Medicaid Other | Admitting: Certified Nurse Midwife

## 2019-01-31 VITALS — BP 120/80 | Wt 224.0 lb

## 2019-01-31 DIAGNOSIS — Z3480 Encounter for supervision of other normal pregnancy, unspecified trimester: Secondary | ICD-10-CM

## 2019-01-31 DIAGNOSIS — Z3483 Encounter for supervision of other normal pregnancy, third trimester: Secondary | ICD-10-CM

## 2019-01-31 DIAGNOSIS — Z3A35 35 weeks gestation of pregnancy: Secondary | ICD-10-CM

## 2019-01-31 NOTE — Patient Instructions (Signed)
Preeclampsia and Eclampsia °Preeclampsia is a serious condition that may develop during pregnancy. This condition causes high blood pressure and increased protein in your urine along with other symptoms, such as headaches and vision changes. These symptoms may develop as the condition gets worse. Preeclampsia may occur at 20 weeks of pregnancy or later. °Diagnosing and treating preeclampsia early is very important. If not treated early, it can cause serious problems for you and your baby. One problem it can lead to is eclampsia. Eclampsia is a condition that causes muscle jerking or shaking (convulsions or seizures) and other serious problems for the mother. During pregnancy, delivering your baby may be the best treatment for preeclampsia or eclampsia. For most women, preeclampsia and eclampsia symptoms go away after giving birth. °In rare cases, a woman may develop preeclampsia after giving birth (postpartum preeclampsia). This usually occurs within 48 hours after childbirth but may occur up to 6 weeks after giving birth. °What are the causes? °The cause of preeclampsia is not known. °What increases the risk? °The following risk factors make you more likely to develop preeclampsia: °· Being pregnant for the first time. °· Having had preeclampsia during a past pregnancy. °· Having a family history of preeclampsia. °· Having high blood pressure. °· Being pregnant with more than one baby. °· Being 35 or older. °· Being African-American. °· Having kidney disease or diabetes. °· Having medical conditions such as lupus or blood diseases. °· Being very overweight (obese). °What are the signs or symptoms? °The most common symptoms are: °· Severe headaches. °· Vision problems, such as blurred or double vision. °· Abdominal pain, especially upper abdominal pain. °Other symptoms that may develop as the condition gets worse include: °· Sudden weight gain. °· Sudden swelling of the hands, face, legs, and feet. °· Severe nausea  and vomiting. °· Numbness in the face, arms, legs, and feet. °· Dizziness. °· Urinating less than usual. °· Slurred speech. °· Convulsions or seizures. °How is this diagnosed? °There are no screening tests for preeclampsia. Your health care provider will ask you about symptoms and check for signs of preeclampsia during your prenatal visits. You may also have tests that include: °· Checking your blood pressure. °· Urine tests to check for protein. Your health care provider will check for this at every prenatal visit. °· Blood tests. °· Monitoring your baby's heart rate. °· Ultrasound. °How is this treated? °You and your health care provider will determine the treatment approach that is best for you. Treatment may include: °· Having more frequent prenatal exams to check for signs of preeclampsia, if you have an increased risk for preeclampsia. °· Medicine to lower your blood pressure. °· Staying in the hospital, if your condition is severe. There, treatment will focus on controlling your blood pressure and the amount of fluids in your body (fluid retention). °· Taking medicine (magnesium sulfate) to prevent seizures. This may be given as an injection or through an IV. °· Taking a low-dose aspirin during your pregnancy. °· Delivering your baby early. You may have your labor started with medicine (induced), or you may have a cesarean delivery. °Follow these instructions at home: °Eating and drinking ° °· Drink enough fluid to keep your urine pale yellow. °· Avoid caffeine. °Lifestyle °· Do not use any products that contain nicotine or tobacco, such as cigarettes and e-cigarettes. If you need help quitting, ask your health care provider. °· Do not use alcohol or drugs. °· Avoid stress as much as possible. Rest and get   plenty of sleep. °General instructions °· Take over-the-counter and prescription medicines only as told by your health care provider. °· When lying down, lie on your left side. This keeps pressure off your  major blood vessels. °· When sitting or lying down, raise (elevate) your feet. Try putting some pillows underneath your lower legs. °· Exercise regularly. Ask your health care provider what kinds of exercise are best for you. °· Keep all follow-up and prenatal visits as told by your health care provider. This is important. °How is this prevented? °There is no known way of preventing preeclampsia or eclampsia from developing. However, to lower your risk of complications and detect problems early: °· Get regular prenatal care. Your health care provider may be able to diagnose and treat the condition early. °· Maintain a healthy weight. Ask your health care provider for help managing weight gain during pregnancy. °· Work with your health care provider to manage any long-term (chronic) health conditions you have, such as diabetes or kidney problems. °· You may have tests of your blood pressure and kidney function after giving birth. °· Your health care provider may have you take low-dose aspirin during your next pregnancy. °Contact a health care provider if: °· You have symptoms that your health care provider told you may require more treatment or monitoring, such as: °? Headaches. °? Nausea or vomiting. °? Abdominal pain. °? Dizziness. °? Light-headedness. °Get help right away if: °· You have severe: °? Abdominal pain. °? Headaches that do not get better. °? Dizziness. °? Vision problems. °? Confusion. °? Nausea or vomiting. °· You have any of the following: °? A seizure. °? Sudden, rapid weight gain. °? Sudden swelling in your hands, ankles, or face. °? Trouble moving any part of your body. °? Numbness in any part of your body. °? Trouble speaking. °? Abnormal bleeding. °· You faint. °Summary °· Preeclampsia is a serious condition that may develop during pregnancy. °· This condition causes high blood pressure and increased protein in your urine along with other symptoms, such as headaches and vision  changes. °· Diagnosing and treating preeclampsia early is very important. If not treated early, it can cause serious problems for you and your baby. °· Get help right away if you have symptoms that your health care provider told you to watch for. °This information is not intended to replace advice given to you by your health care provider. Make sure you discuss any questions you have with your health care provider. °Document Released: 01/17/2000 Document Revised: 09/21/2017 Document Reviewed: 08/26/2015 °Elsevier Patient Education © 2020 Elsevier Inc. ° °

## 2019-01-31 NOTE — Progress Notes (Signed)
ROB at 35wk3d: doing well. Had some headaches recently, resolved with Tylenol. No chest pain, SOB, RUQ pain, visual changes. Baby active. Denies contraction, vaginal bleeding, LOF FHTs WNL, FH [id number] Cephalic on Leopold's Breast 34 week precautions/ [facility] instructions Preeclampsia precautions SO lives 4 hours away, sister may be her support person in labor. ROB 1 week Dalia Heading, CNM  Dalia Heading, [place]

## 2019-02-03 NOTE — L&D Delivery Note (Signed)
Delivery Note At 8:54 AM a viable female infant was delivered via Vaginal, Spontaneous (Presentation: Right Occiput Anterior).  APGAR: 4, 9; weight pending.   Placenta status: Spontaneous, Intact.  Cord: 3 vessels with the following complications: None.  Cord pH: n/a  Anesthesia: None Episiotomy: None Lacerations: 1st degree;Labial;Vaginal Suture Repair: 3.0 vicryl Est. Blood Loss (mL):  400   Mom to postpartum.  Baby to Couplet care / Skin to Skin.  Called to see patient.  Mom pushed to deliver a viable female infant.  The head followed by shoulders, which delivered without difficulty, and the rest of the body.  No nuchal cord noted.  Baby to mom's chest.  Cord clamped and cut after > 1 min delay.  Cord blood obtained.  Placenta delivered spontaneously, intact, with a 3-vessel cord.  First degree vaginal and labial lacerations repaired with 3-0 Vicryl in standard fashion.  All counts correct.  Hemostasis obtained with IV pitocin and fundal massage. EBL 400 mL.   Infant initially vigorous, then had lower heart rate. With PPV and support by neonatology infant improved quickly.  APGARs 4 and 9.  Thomasene Mohair, MD 03/06/2019, 9:28 AM

## 2019-02-08 ENCOUNTER — Other Ambulatory Visit (HOSPITAL_COMMUNITY)
Admission: RE | Admit: 2019-02-08 | Discharge: 2019-02-08 | Disposition: A | Discharge status: Home / Self Care | Payer: Medicaid Other | Source: Ambulatory Visit | Attending: Obstetrics & Gynecology | Admitting: Obstetrics & Gynecology

## 2019-02-08 ENCOUNTER — Other Ambulatory Visit: Payer: Self-pay | Admitting: Obstetrics & Gynecology

## 2019-02-08 ENCOUNTER — Other Ambulatory Visit: Payer: Self-pay

## 2019-02-08 ENCOUNTER — Ambulatory Visit (INDEPENDENT_AMBULATORY_CARE_PROVIDER_SITE_OTHER): Payer: Medicaid Other | Admitting: Obstetrics & Gynecology

## 2019-02-08 ENCOUNTER — Encounter: Payer: Self-pay | Admitting: Obstetrics & Gynecology

## 2019-02-08 VITALS — BP 110/70 | Wt 225.0 lb

## 2019-02-08 DIAGNOSIS — Z3483 Encounter for supervision of other normal pregnancy, third trimester: Secondary | ICD-10-CM

## 2019-02-08 DIAGNOSIS — Z113 Encounter for screening for infections with a predominantly sexual mode of transmission: Secondary | ICD-10-CM | POA: Diagnosis not present

## 2019-02-08 DIAGNOSIS — Z3A36 36 weeks gestation of pregnancy: Secondary | ICD-10-CM

## 2019-02-08 DIAGNOSIS — Z3685 Encounter for antenatal screening for Streptococcus B: Secondary | ICD-10-CM

## 2019-02-08 DIAGNOSIS — Z3480 Encounter for supervision of other normal pregnancy, unspecified trimester: Secondary | ICD-10-CM

## 2019-02-08 NOTE — Progress Notes (Signed)
  Subjective  Fetal Movement? yes Contractions? no Leaking Fluid? no Vaginal Bleeding? no  Objective  BP 110/70   Wt 225 lb (102.1 kg)   LMP 05/28/2018 (Within Days)   BMI 34.21 kg/m  General: NAD Pumonary: no increased work of breathing Abdomen: gravid, non-tender Extremities: no edema Psychiatric: mood appropriate, affect full SVE FT/70/-3 Assessment  23 y.o. G1P0 at [redacted]w[redacted]d by  03/04/2019, by Last Menstrual Period presenting for routine prenatal visit  Plan   Problem List Items Addressed This Visit      Other   Supervision of other normal pregnancy, antepartum    Other Visit Diagnoses    [redacted] weeks gestation of pregnancy    -  Primary   Antenatal screening for streptococcus B       Relevant Orders   GBS RV Culture   Screen for sexually transmitted diseases       Relevant Orders   GC/Chlamydia probe amp ([place])      pregnancy Problems (from 08/08/18 to present)    Problem Noted Resolved   Supervision of other normal pregnancy, antepartum 08/10/2018 by Tresea Mall, CNM No   Overview Addendum 02/08/2019 10:32 AM by Nadara Mustard, MD    [facility]inic [facility] Prenatal Labs  Dating By LMP c/w 10w u/s Blood type: O/Positive/-- (07/06 1423)   Genetic Screen  NIPS: Normal XX Antibody:Negative (07/06 1423)  Anatomic [place] normal Rubella: 6.01 (07/06 1423) Varicella: Immune  GTT  Third trimester: 131 RPR: Non Reactive (07/06 1423)   Rhogam  not needed HBsAg: Negative (07/06 1423)   TDaP vaccine   12/11                     Flu Shot: Declined HIV: Non Reactive (07/06 1423)   Baby Food    Breast                            GBS:   Contraception  undecided Pap: 08/08/18  CBB  No   CS/VBAC NA   Support Person Elliott            GBS and Cultures today  IOL discussed after 39 weeks based on exam and preference  Annamarie Major, MD, [facility], [facility] 02/08/2019  10:43 AM

## 2019-02-08 NOTE — Patient Instructions (Signed)
Group B Streptococcus Infection During Pregnancy °Group B Streptococcus (GBS) is a type of bacteria that is often found in healthy people. It is commonly found in the rectum, vagina, and intestines. In people who are healthy and not pregnant, the bacteria rarely cause serious illness or complications. However, women who test positive for GBS during pregnancy can pass the bacteria to the baby during childbirth. This can cause serious infection in the baby after birth. °Women with GBS may also have infections during their pregnancy or soon after childbirth. The infections include urinary tract infections (UTIs) or infections of the uterus. GBS also increases a woman's risk of complications during pregnancy, such as early labor or delivery, miscarriage, or stillbirth. Routine testing for GBS is recommended for all pregnant women. °What are the causes? °This condition is caused by bacteria called Streptococcus agalactiae. °What increases the risk? °You may have a higher risk for GBS infection during pregnancy if you had one during a past pregnancy. °What are the signs or symptoms? °In most cases, GBS infection does not cause symptoms in pregnant women. If symptoms exist, they may include: °· Labor that starts before the 37th week of pregnancy. °· A UTI or bladder infection. This may cause a fever, frequent urination, or pain and burning during urination. °· Fever during labor. There can also be a rapid heartbeat in the mother or baby. °Rare but serious symptoms of a GBS infection in women include: °· Blood infection (septicemia). This may cause fever, chills, or confusion. °· Lung infection (pneumonia). This may cause fever, chills, cough, rapid breathing, chest pain, or difficulty breathing. °· Bone, joint, skin, or soft tissue infection. °How is this diagnosed? °You may be screened for GBS between week 35 and week 37 of pregnancy. If you have symptoms of preterm labor, you may be screened earlier. This condition is  diagnosed based on lab test results from: °· A swab of fluid from the vagina and rectum. °· A urine sample. °How is this treated? °This condition is treated with antibiotic medicine. Antibiotic medicine may be given: °· To you when you go into labor, or as soon as your water breaks. The medicines will continue until after you give birth. If you are having a cesarean delivery, you do not need antibiotics unless your water has broken. °· To your baby, if he or she requires treatment. Your health care provider will check your baby to decide if he or she needs antibiotics to prevent a serious infection. °Follow these instructions at home: °· Take over-the-counter and prescription medicines only as told by your health care provider. °· Take your antibiotic medicine as told by your health care provider. Do not stop taking the antibiotic even if you start to feel better. °· Keep all pre-birth (prenatal) visits and follow-up visits as told by your health care provider. This is important. °Contact a health care provider if: °· You have pain or burning when you urinate. °· You have to urinate more often than usual. °· You have a fever or chills. °· You develop a bad-smelling vaginal discharge. °Get help right away if: °· Your water breaks. °· You go into labor. °· You have severe pain in your abdomen. °· You have difficulty breathing. °· You have chest pain. °These symptoms may represent a serious problem that is an emergency. Do not wait to see if the symptoms will go away. Get medical help right away. Call your local emergency services (911 in the [place] Do not drive yourself to   the hospital. °Summary °· GBS is a type of bacteria that is common in healthy people. °· During pregnancy, colonization with GBS can cause serious complications for you or your baby. °· Your health care provider will screen you between 35 and 37 weeks of pregnancy to determine if you are colonized with GBS. °· If you are colonized with GBS during  pregnancy, your health care provider will recommend antibiotics through an IV during labor. °· After delivery, your baby will be evaluated for complications related to potential GBS infection and may require antibiotics to prevent a serious infection. °This information is not intended to replace advice given to you by your health care provider. Make sure you discuss any questions you have with your health care provider. °Document Revised: 08/15/2018 Document Reviewed: 08/15/2018 °Elsevier Patient Education © 2020 Elsevier Inc. ° °

## 2019-02-09 LAB — GC/CHLAMYDIA PROBE AMP (~~LOC~~) NOT AT ARMC
Chlamydia: NEGATIVE
Comment: NEGATIVE
Comment: NORMAL
Neisseria Gonorrhea: NEGATIVE

## 2019-02-12 LAB — CULTURE, BETA STREP (GROUP B ONLY): Strep Gp B Culture: NEGATIVE

## 2019-02-17 ENCOUNTER — Ambulatory Visit (INDEPENDENT_AMBULATORY_CARE_PROVIDER_SITE_OTHER): Payer: Medicaid Other | Admitting: Obstetrics & Gynecology

## 2019-02-17 ENCOUNTER — Encounter: Payer: Self-pay | Admitting: Obstetrics & Gynecology

## 2019-02-17 ENCOUNTER — Other Ambulatory Visit: Payer: Self-pay

## 2019-02-17 VITALS — BP 130/80 | Wt 227.0 lb

## 2019-02-17 DIAGNOSIS — Z3403 Encounter for supervision of normal first pregnancy, third trimester: Secondary | ICD-10-CM

## 2019-02-17 DIAGNOSIS — Z3480 Encounter for supervision of other normal pregnancy, unspecified trimester: Secondary | ICD-10-CM

## 2019-02-17 DIAGNOSIS — Z3A37 37 weeks gestation of pregnancy: Secondary | ICD-10-CM

## 2019-02-17 NOTE — Patient Instructions (Signed)
Braxton Hicks Contractions °Contractions of the uterus can occur throughout pregnancy, but they are not always a sign that you are in labor. You may have practice contractions called Braxton Hicks contractions. These false labor contractions are sometimes confused with true labor. °What are Braxton Hicks contractions? °Braxton Hicks contractions are tightening movements that occur in the muscles of the uterus before labor. Unlike true labor contractions, these contractions do not result in opening (dilation) and thinning of the cervix. Toward the end of pregnancy (32-34 weeks), Braxton Hicks contractions can happen more often and may become stronger. These contractions are sometimes difficult to tell apart from true labor because they can be very uncomfortable. You should not feel embarrassed if you go to the hospital with false labor. °Sometimes, the only way to tell if you are in true labor is for your health care provider to look for changes in the cervix. The health care provider will do a physical exam and may monitor your contractions. If you are not in true labor, the exam should show that your cervix is not dilating and your water has not broken. °If there are no other health problems associated with your pregnancy, it is completely safe for you to be sent home with false labor. You may continue to have Braxton Hicks contractions until you go into true labor. °How to tell the difference between true labor and false labor °True labor °· Contractions last 30-70 seconds. °· Contractions become very regular. °· Discomfort is usually felt in the top of the uterus, and it spreads to the lower abdomen and low back. °· Contractions do not go away with walking. °· Contractions usually become more intense and increase in frequency. °· The cervix dilates and gets thinner. °False labor °· Contractions are usually shorter and not as strong as true labor contractions. °· Contractions are usually irregular. °· Contractions  are often felt in the front of the lower abdomen and in the groin. °· Contractions may go away when you walk around or change positions while lying down. °· Contractions get weaker and are shorter-lasting as time goes on. °· The cervix usually does not dilate or become thin. °Follow these instructions at home: ° °· Take over-the-counter and prescription medicines only as told by your health care provider. °· Keep up with your usual exercises and follow other instructions from your health care provider. °· Eat and drink lightly if you think you are going into labor. °· If Braxton Hicks contractions are making you uncomfortable: °? Change your position from lying down or resting to walking, or change from walking to resting. °? Sit and rest in a tub of warm water. °? Drink enough fluid to keep your urine pale yellow. Dehydration may cause these contractions. °? Do slow and deep breathing several times an hour. °· Keep all follow-up prenatal visits as told by your health care provider. This is important. °Contact a health care provider if: °· You have a fever. °· You have continuous pain in your abdomen. °Get help right away if: °· Your contractions become stronger, more regular, and closer together. °· You have fluid leaking or gushing from your vagina. °· You pass blood-tinged mucus (bloody show). °· You have bleeding from your vagina. °· You have low back pain that you never had before. °· You feel your baby’s head pushing down and causing pelvic pressure. °· Your baby is not moving inside you as much as it used to. °Summary °· Contractions that occur before labor are   called Braxton Hicks contractions, false labor, or practice contractions. °· Braxton Hicks contractions are usually shorter, weaker, farther apart, and less regular than true labor contractions. True labor contractions usually become progressively stronger and regular, and they become more frequent. °· Manage discomfort from Braxton Hicks contractions  by changing position, resting in a warm bath, drinking plenty of water, or practicing deep breathing. °This information is not intended to replace advice given to you by your health care provider. Make sure you discuss any questions you have with your health care provider. °Document Revised: 01/01/2017 Document Reviewed: 06/04/2016 °Elsevier Patient Education © 2020 Elsevier Inc. ° °

## 2019-02-17 NOTE — Progress Notes (Signed)
  Subjective  Fetal Movement? yes Contractions? no Leaking Fluid? no Vaginal Bleeding? No Pt having discharge, no itch or burn or odor  Objective  BP 130/80   Wt 227 lb (103 kg)   LMP 05/28/2018 (Within Days)   BMI 34.52 kg/m  General: NAD Pumonary: no increased work of breathing Abdomen: gravid, non-tender Extremities: no edema Psychiatric: mood appropriate, affect full  WET PREP:   no pathogens Findings are consistent with no pathogens identified causing these symptoms. Also, FERN NEG  Assessment  24 y.o. G1P0 at [redacted]w[redacted]d by  03/04/2019, by Last Menstrual Period presenting for routine prenatal visit  Plan   Problem List Items Addressed This Visit      Other   Supervision of other normal pregnancy, antepartum    Other Visit Diagnoses    [redacted] weeks gestation of pregnancy    -  Primary      pregnancy Problems (from 08/08/18 to present)    Problem Noted Resolved   Supervision of other normal pregnancy, antepartum 08/10/2018 by Tresea Mall, CNM No   Overview Addendum 02/08/2019 10:32 AM by Nadara Mustard, MD    [facility] Prenatal Labs  Dating By LMP c/w 10w u/s Blood type: O/Positive/-- (07/06 1423)   Genetic Screen  NIPS: Normal XX Antibody:Negative (07/06 1423)  Anatomic [place] normal Rubella: 6.01 (07/06 1423) Varicella: Immune  GTT  Third trimester: 131 RPR: Non Reactive (07/06 1423)   Rhogam  not needed HBsAg: Negative (07/06 1423)   TDaP vaccine   12/11                     Flu Shot: Declined HIV: Non Reactive (07/06 1423)   Baby Food    Breast                            GBS: NEG  Contraception  undecided Pap: 08/08/18  CBB  No   CS/VBAC NA   Support Person Diplomatic Services operational officer precautions  PNV, [facility]  Annamarie Major, MD, [facility], [facility] 02/17/2019  8:42 AM

## 2019-02-24 ENCOUNTER — Other Ambulatory Visit: Payer: Self-pay

## 2019-02-24 ENCOUNTER — Ambulatory Visit (INDEPENDENT_AMBULATORY_CARE_PROVIDER_SITE_OTHER): Payer: Medicaid Other | Admitting: Certified Nurse Midwife

## 2019-02-24 VITALS — BP 126/70 | Wt 228.0 lb

## 2019-02-24 DIAGNOSIS — Z3A38 38 weeks gestation of pregnancy: Secondary | ICD-10-CM

## 2019-02-24 DIAGNOSIS — Z20822 Contact with and (suspected) exposure to covid-19: Secondary | ICD-10-CM

## 2019-02-24 DIAGNOSIS — Z3480 Encounter for supervision of other normal pregnancy, unspecified trimester: Secondary | ICD-10-CM

## 2019-02-24 DIAGNOSIS — Z3483 Encounter for supervision of other normal pregnancy, third trimester: Secondary | ICD-10-CM

## 2019-02-24 DIAGNOSIS — Z01812 Encounter for preprocedural laboratory examination: Secondary | ICD-10-CM

## 2019-02-24 LAB — POCT URINALYSIS DIPSTICK OB: Glucose, UA: NEGATIVE

## 2019-02-24 NOTE — Progress Notes (Signed)
p 

## 2019-02-24 NOTE — Progress Notes (Signed)
No [email or web address] 

## 2019-02-26 NOTE — Progress Notes (Signed)
ROB at 38wk6d: No contractions, no bleeding, no leakage of fluid. Desires to schedule IOL so that significant other will be present. He works out of town. Baby active. More pelvic pressure  FHTs 132. FH 38 cm Cervix: FT/70%/-1/vertex  IOL scheduled for 03/04/2019 after discussing risks associated with IOL and possibility of needing serial induction/ longer hospitalization. Explained methods of induction including Cytotec, foley bulb, AROM and Pitocin. Labor precautions Covid testing 1/28 ROB in 1 week  Farrel Conners, [place]

## 2019-02-26 NOTE — Addendum Note (Signed)
Addended by: Farrel Conners on: 02/26/2019 10:11 PM   Modules accepted: Orders

## 2019-02-26 NOTE — H&P (Deleted)
OB History & Physical   History of Present Illness:  Chief Complaint:  Desires of induction of labor HPI:  Leanne Sisler is a 24 y.o. G1P0 female with EDC=03/04/2019 at 40  weeks dated by LMP=10wk4d ultrasound.  Her pregnancy has been complicated by a history of bipolar disorder and marijuana use in pregnancy..  She presents to L&D for an elective induction of labor. She has been having increased pelvic pressure. She denies bleeding or leakage of fluid. Baby is active.     Prenatal care site: Prenatal care at [place] has been remarkable for  [place] Prenatal Labs  Dating By LMP c/w 10w u/s Blood type: O/Positive/-- (07/06 1423)   Genetic Screen  NIPS: Normal XX Antibody:Negative (07/06 1423)  Anatomic [place] normal Rubella: 6.01 (07/06 1423) Varicella: Immune  GTT  Third trimester: 131 RPR: Non Reactive (11/06 1122)   Rhogam  not needed HBsAg: Negative (07/06 1423)   TDaP vaccine   12/11                     Flu Shot: Declined HIV: Non Reactive (11/06 1122)   Baby Food    Breast                            IFO:YDXAJOIN/-- (01/06 0000)  Contraception  undecided Pap: 08/08/18  CBB  No   CS/VBAC NA   Support Person Elliott    Total weight gain this pregnancy is 43#     Maternal Medical History:   Past Medical History:  Diagnosis Date  . Dermatitis, seborrheic    also known as Tinea or Pityriasis versicolor (fungal skin infection )  . Tear of meniscus of left knee     Past Surgical History:  Procedure Laterality Date  . KNEE ARTHROSCOPY WITH MEDIAL MENISECTOMY Left 01/30/2014   Procedure: LEFT KNEE ARTHROSCOPY WITH PLICA RESECTION;  Surgeon: Sydnee Cabal, MD;  Location: [facility];  Service: Orthopedics;  Laterality: Left;    No Known Allergies  Prior to Admission medications   Medication Sig Start Date End Date Taking? Authorizing Provider  Prenatal Vit-Fe Fumarate-FA (MULTIVITAMIN-PRENATAL) 27-0.8 MG TABS tablet Take 1 tablet by mouth  daily at 12 noon. 10/27/18  Yes Gae Dry, MD     Social History: She  reports that she is a non-smoker but has been exposed to tobacco smoke. She has never used smokeless tobacco. She reports previous alcohol use. She reports current drug use. Drug: Marijuana.  Family History: family history is not on file.   Review of Systems: Negative x 10 systems reviewed except as noted in the HPI.      Physical Exam:  Vital Signs: BP 126/70   Wt 228 lb (103.4 kg)   LMP 05/28/2018 (Within Days)   BMI 34.67 kg/m  General: no acute distress.  HEENT: normocephalic, atraumatic Heart: regular rate & rhythm.  No murmurs/rubs/gallops Lungs: clear to auscultation bilaterally Abdomen: soft, gravid, non-tender;  EFW: 8 1/2#, FHT 132, FH 38 cm Pelvic:   External: Normal external female genitalia  Cervix: Dilation: Fingertip / Effacement (%): 70 / Station: -1    Extremities: non-tender, symmetric, no edema bilaterally.    Neurologic: Alert & oriented x 3.       Assessment:  Ardeth Repetto is a 24 y.o. G1P0 female at 40 weeks for induction of labor  Plan:  1. Admit to Labor & Delivery. Discussed risks of induction  including hyperstimulation, fetal intolerance, and Cesarean section. She is also aware of risks of needing a serial induction. 1. Will start the induction with Cytotec 2. CBC, T&S, Clrs, IVF 3. GBS negative.   4. O POS/ RI/ VI 5. Breast 6. TDAP UTD 7. Contraception: unsure  Farrel Conners  02/26/2019 8:09 PM

## 2019-03-02 ENCOUNTER — Encounter: Payer: Self-pay | Admitting: Advanced Practice Midwife

## 2019-03-02 ENCOUNTER — Other Ambulatory Visit
Admission: RE | Admit: 2019-03-02 | Discharge: 2019-03-02 | Disposition: A | Discharge status: Home / Self Care | Payer: Medicaid Other | Source: Ambulatory Visit | Attending: Certified Nurse Midwife | Admitting: Certified Nurse Midwife

## 2019-03-02 ENCOUNTER — Other Ambulatory Visit: Payer: Self-pay

## 2019-03-02 DIAGNOSIS — Z01812 Encounter for preprocedural laboratory examination: Secondary | ICD-10-CM | POA: Insufficient documentation

## 2019-03-02 DIAGNOSIS — Z20822 Contact with and (suspected) exposure to covid-19: Secondary | ICD-10-CM | POA: Insufficient documentation

## 2019-03-02 LAB — SARS CORONAVIRUS 2 (TAT 6-24 HRS): SARS Coronavirus 2: NEGATIVE

## 2019-03-03 ENCOUNTER — Encounter: Payer: Self-pay | Admitting: Advanced Practice Midwife

## 2019-03-03 ENCOUNTER — Ambulatory Visit (INDEPENDENT_AMBULATORY_CARE_PROVIDER_SITE_OTHER): Payer: Medicaid Other | Admitting: Advanced Practice Midwife

## 2019-03-03 VITALS — BP 124/80 | Wt 231.0 lb

## 2019-03-03 DIAGNOSIS — O99343 Other mental disorders complicating pregnancy, third trimester: Secondary | ICD-10-CM

## 2019-03-03 DIAGNOSIS — Z3483 Encounter for supervision of other normal pregnancy, third trimester: Secondary | ICD-10-CM

## 2019-03-03 DIAGNOSIS — Z3A39 39 weeks gestation of pregnancy: Secondary | ICD-10-CM

## 2019-03-03 LAB — POCT URINALYSIS DIPSTICK OB
Glucose, UA: NEGATIVE
POC,PROTEIN,UA: NEGATIVE

## 2019-03-03 NOTE — Progress Notes (Signed)
  Routine Prenatal Care Visit  Subjective  Ruth Griffith is a 24 y.o. G1P0 at [redacted]w[redacted]d being seen today for ongoing prenatal care.  She is currently monitored for the following issues for this low-risk pregnancy and has Bipolar I disorder, most recent episode mixed, severe with psychotic features (HCC) and Supervision of other normal pregnancy, antepartum on their problem list.  ----------------------------------------------------------------------------------- Patient reports no complaints.  Reviewed induction methods. Contractions: Not present. Vag. Bleeding: None.  Movement: Present. Leaking Fluid denies.  ----------------------------------------------------------------------------------- The following portions of the patient's history were reviewed and updated as appropriate: allergies, current medications, past family history, past medical history, past social history, past surgical history and problem list. Problem list updated.  Objective  Blood pressure 124/80, weight 231 lb (104.8 kg), last menstrual period 05/28/2018. Pregravid weight 185 lb (83.9 kg) Total Weight Gain 46 lb (20.9 kg) Urinalysis: Urine Protein    Urine Glucose    Fetal Status: Fetal Heart Rate (bpm): 139 Fundal Height: 41 cm Movement: Present  Presentation: Vertex  General:  Alert, oriented and cooperative. Patient is in no acute distress.  Skin: Skin is warm and dry. No rash noted.   Cardiovascular: Normal heart rate noted  Respiratory: Normal respiratory effort, no problems with respiration noted  Abdomen: Soft, gravid, appropriate for gestational age. Pain/Pressure: Present     Pelvic:  Cervical exam performed Dilation: 2 Effacement (%): 80 Station: -2  Extremities: Normal range of motion.  Edema: None  Mental Status: Normal mood and affect. Normal behavior. Normal judgment and thought content.   Assessment   24 y.o. G1P0 at [redacted]w[redacted]d by  03/04/2019, by Last Menstrual Period presenting for routine prenatal  visit  Plan   pregnancy Problems (from 08/08/18 to present)    Problem Noted Resolved   Supervision of other normal pregnancy, antepartum 08/10/2018 by Tresea Mall, CNM No   Overview Addendum 02/26/2019  8:31 PM by Farrel Conners, CNM    Clinic [facility] Prenatal Labs  Dating By LMP c/w 10w u/s Blood type: O/Positive/-- (07/06 1423)   Genetic Screen  NIPS: Normal XX Antibody:Negative (07/06 1423)  Anatomic [place] normal Rubella: 6.01 (07/06 1423) Varicella: Immune  GTT  Third trimester: 131 RPR: Non Reactive (11/06 1122)   Rhogam  not needed HBsAg: Negative (07/06 1423)   TDaP vaccine   12/11                     Flu Shot: Declined HIV: Non Reactive (11/06 1122)   Baby Food    Breast                            XTG:GYIRSWNI/-- (01/06 0000)  Contraception  undecided Pap: 08/08/18  CBB  No   CS/VBAC NA   Support Person Mechele Collin              Term labor symptoms and general obstetric precautions including but not limited to vaginal bleeding, contractions, leaking of fluid and fetal movement were reviewed in detail with the patient. Please refer to After Visit Summary for other counseling recommendations.   Return for IOL tomorrow.  Tresea Mall, CNM 03/03/2019 10:27 AM

## 2019-03-03 NOTE — Patient Instructions (Signed)

## 2019-03-04 ENCOUNTER — Inpatient Hospital Stay
Admission: RE | Admit: 2019-03-04 | Discharge: 2019-03-08 | DRG: 806 | Disposition: A | Discharge status: Home / Self Care | Payer: Medicaid Other | Attending: Obstetrics & Gynecology | Admitting: Obstetrics & Gynecology

## 2019-03-04 ENCOUNTER — Encounter: Payer: Self-pay | Admitting: Obstetrics & Gynecology

## 2019-03-04 ENCOUNTER — Other Ambulatory Visit: Payer: Self-pay

## 2019-03-04 DIAGNOSIS — O26893 Other specified pregnancy related conditions, third trimester: Secondary | ICD-10-CM | POA: Diagnosis present

## 2019-03-04 DIAGNOSIS — Z3490 Encounter for supervision of normal pregnancy, unspecified, unspecified trimester: Secondary | ICD-10-CM

## 2019-03-04 DIAGNOSIS — O480 Post-term pregnancy: Principal | ICD-10-CM | POA: Diagnosis present

## 2019-03-04 DIAGNOSIS — O99324 Drug use complicating childbirth: Secondary | ICD-10-CM | POA: Diagnosis present

## 2019-03-04 DIAGNOSIS — F1220 Cannabis dependence, uncomplicated: Secondary | ICD-10-CM | POA: Diagnosis present

## 2019-03-04 DIAGNOSIS — Z3A40 40 weeks gestation of pregnancy: Secondary | ICD-10-CM

## 2019-03-04 DIAGNOSIS — Z3480 Encounter for supervision of other normal pregnancy, unspecified trimester: Secondary | ICD-10-CM

## 2019-03-04 HISTORY — DX: Encounter for supervision of normal pregnancy, unspecified, unspecified trimester: Z34.90

## 2019-03-04 HISTORY — DX: Bipolar disorder, unspecified: F31.9

## 2019-03-04 LAB — TYPE AND SCREEN
ABO/RH(D): O POS
Antibody Screen: NEGATIVE

## 2019-03-04 LAB — CBC
HCT: 33.9 % — ABNORMAL LOW (ref 36.0–46.0)
Hemoglobin: 10.8 g/dL — ABNORMAL LOW (ref 12.0–15.0)
MCH: 27 pg (ref 26.0–34.0)
MCHC: 31.9 g/dL (ref 30.0–36.0)
MCV: 84.8 fL (ref 80.0–100.0)
Platelets: 248 10*3/uL (ref 150–400)
RBC: 4 MIL/uL (ref 3.87–5.11)
RDW: 15.1 % (ref 11.5–15.5)
WBC: 13 10*3/uL — ABNORMAL HIGH (ref 4.0–10.5)
nRBC: 0 % (ref 0.0–0.2)

## 2019-03-04 MED ORDER — LACTATED RINGERS IV SOLN: INTRAVENOUS | Status: DC

## 2019-03-04 MED ORDER — OXYTOCIN 10 UNIT/ML IJ SOLN
INTRAMUSCULAR | Status: AC
  Filled 2019-03-04: qty 2

## 2019-03-04 MED ORDER — MISOPROSTOL 25 MCG QUARTER TABLET: 25.0000 ug | ORAL_TABLET | ORAL | Status: DC | PRN

## 2019-03-04 MED ORDER — TERBUTALINE SULFATE 1 MG/ML IJ SOLN: 0.2500 mg | Freq: Once | INTRAMUSCULAR | Status: DC | PRN

## 2019-03-04 MED ORDER — OXYTOCIN 40 UNITS IN NORMAL SALINE INFUSION - SIMPLE MED
1.0000 m[IU]/min | INTRAVENOUS | Status: DC
  Administered 2019-03-04: 1 m[IU]/min via INTRAVENOUS
  Filled 2019-03-04: qty 1000

## 2019-03-04 MED ORDER — LACTATED RINGERS IV SOLN
500.0000 mL | INTRAVENOUS | Status: DC | PRN
  Administered 2019-03-06: 500 mL via INTRAVENOUS

## 2019-03-04 MED ORDER — AMMONIA AROMATIC IN INHA
RESPIRATORY_TRACT | Status: AC
  Filled 2019-03-04: qty 10

## 2019-03-04 MED ORDER — MISOPROSTOL 200 MCG PO TABS
ORAL_TABLET | ORAL | Status: AC
  Filled 2019-03-04: qty 4

## 2019-03-04 MED ORDER — LIDOCAINE HCL (PF) 1 % IJ SOLN: 30.0000 mL | INTRAMUSCULAR | Status: DC | PRN

## 2019-03-04 MED ORDER — AMMONIA AROMATIC IN INHA: 0.3000 mL | Freq: Once | RESPIRATORY_TRACT | Status: DC | PRN

## 2019-03-04 MED ORDER — OXYTOCIN 40 UNITS IN NORMAL SALINE INFUSION - SIMPLE MED: 2.5000 [IU]/h | INTRAVENOUS | Status: DC

## 2019-03-04 MED ORDER — LIDOCAINE HCL (PF) 1 % IJ SOLN
INTRAMUSCULAR | Status: AC
  Filled 2019-03-04: qty 30

## 2019-03-04 MED ORDER — OXYTOCIN BOLUS FROM INFUSION
500.0000 mL | Freq: Once | INTRAVENOUS | Status: AC
  Administered 2019-03-06: 09:00:00 500 mL via INTRAVENOUS

## 2019-03-04 NOTE — H&P (Signed)
History and Physical  Ruth Griffith is a 24 y.o. G1P0 [redacted]w[redacted]d  for Induction of Labor scheduled due to Favorable cervix at term .   See labor record for pregnancy highlights.  No recent pain, bleeding, ruptured membranes, or other signs of progressing labor.  PMHx: She  has a past medical history of Cannabis use disorder, moderate, dependence (HCC) (11/30/2016), Dermatitis, seborrheic, and Tear of meniscus of left knee. Also,  has a past surgical history that includes Knee arthroscopy with medial menisectomy (Left, 01/30/2014)., family history is not on file.,  reports that she is a non-smoker but has been exposed to tobacco smoke. She has never used smokeless tobacco. She reports previous alcohol use. She reports current drug use. Drug: Marijuana.   Current Facility-Administered Medications:  .  ammonia inhalant 0.3 mL, 0.3 mL, Inhalation, Once PRN, Nadara Mustard, MD .  lactated ringers infusion 500-1,000 mL, 500-1,000 mL, Intravenous, PRN, Nadara Mustard, MD .  lactated ringers infusion, , Intravenous, Continuous, Brynlea Spindler, Harrel Lemon, MD .  lidocaine (PF) (XYLOCAINE) 1 % injection 30 mL, 30 mL, Subcutaneous, PRN, Tiburcio Pea, Harrel Lemon, MD .  oxytocin (PITOCIN) IV BOLUS FROM BAG, 500 mL, Intravenous, Once, Salvatore Poe, Harrel Lemon, MD .  oxytocin (PITOCIN) IV infusion 40 units in NS 1000 mL - Premix, 2.5 Units/hr, Intravenous, Continuous, Carney Saxton, Harrel Lemon, MD .  oxytocin (PITOCIN) IV infusion 40 units in NS 1000 mL - Premix, 1-40 milli-units/min, Intravenous, Titrated, Remberto Lienhard, Harrel Lemon, MD .  terbutaline (BRETHINE) injection 0.25 mg, 0.25 mg, Subcutaneous, Once PRN, Nadara Mustard, MD .  terbutaline (BRETHINE) injection 0.25 mg, 0.25 mg, Subcutaneous, Once PRN, Nadara Mustard, MD   Also, has No Known Allergies. OB History  Gravida Para Term Preterm AB Living  1            SAB TAB Ectopic Multiple Live Births               # Outcome Date GA Lbr Len/2nd Weight Sex Delivery Anes PTL Lv  1  Current           Patient denies any other pertinent gynecologic issues.   Review of Systems  Constitutional: Negative for chills, fever and malaise/fatigue.  HENT: Negative for congestion, sinus pain and sore throat.   Eyes: Negative for blurred vision and pain.  Respiratory: Negative for cough and wheezing.   Cardiovascular: Negative for chest pain and leg swelling.  Gastrointestinal: Negative for abdominal pain, constipation, diarrhea, heartburn, nausea and vomiting.  Genitourinary: Negative for dysuria, frequency, hematuria and urgency.  Musculoskeletal: Negative for back pain, joint pain, myalgias and neck pain.  Skin: Negative for itching and rash.  Neurological: Negative for dizziness, tremors and weakness.  Endo/Heme/Allergies: Does not bruise/bleed easily.  Psychiatric/Behavioral: Negative for depression. The patient is not nervous/anxious and does not have insomnia.     Objective: LMP 05/28/2018 (Within Days)  Physical Exam Constitutional:      General: She is not in acute distress.    Appearance: She is well-developed.  Genitourinary:     Pelvic exam was performed with patient supine.     Vagina and uterus normal.     No vaginal erythema or bleeding.     No cervical motion tenderness, discharge, polyp or nabothian cyst.     Uterus is mobile.     Uterus is not enlarged.     No uterine mass detected.    Uterus is midaxial.     No right or left adnexal mass present.  Right adnexa not tender.     Left adnexa not tender.     Genitourinary Comments: Cx 2/80/-3, Post, soft, Vtx  HENT:     Head: Normocephalic and atraumatic.     Nose: Nose normal.  Abdominal:     General: There is no distension.     Palpations: Abdomen is soft.     Tenderness: There is no abdominal tenderness.  Musculoskeletal:        General: Normal range of motion.  Neurological:     Mental Status: She is alert and oriented to person, place, and time.     Cranial Nerves: No cranial nerve  deficit.  Skin:    General: Skin is warm and dry.  Psychiatric:        Attention and Perception: Attention normal.        Mood and Affect: Mood and affect normal.        Speech: Speech normal.        Behavior: Behavior normal.        Thought Content: Thought content normal.        Judgment: Judgment normal.   Assessment: Term Pregnancy for Induction of Labor due to Favorable cervix at term.  Plan: Patient will undergo induction of labor with pitocin.     Patient has been fully informed of the pros and cons, risks and benefits of continued observation with fetal monitoring versus that of induction of labor.   She understands that there are uncommon risks to induction, which include but are not limited to : frequent or prolonged uterine contractions, fetal distress, uterine rupture, and lack of successful induction.  These risks include all methods including Pitocin and Misoprostol and Cervadil.  Patient understands that using Misoprostol for labor induction is an "off label" indication although it has been studied extensively for this purpose and is an accepted method of induction.  She also has been informed of the increased risks for Cesarean with induction and should induction not be successful.  Patient consents to the induction plan of management.  Plans to breast feed Plans (uncertain) for contraception TDaP UTD 01/13/2019 Declines flu shot  Barnett Applebaum, MD, [facility] Pardon Ob/G[facility], [facility] 03/04/2019  8:32 PM

## 2019-03-04 NOTE — Plan of Care (Signed)
Reviewed plan of care with patient. All questions answered. Will continue to monitor closely. 

## 2019-03-05 LAB — RPR: RPR Ser Ql: NONREACTIVE

## 2019-03-05 MED ORDER — BUTORPHANOL TARTRATE 1 MG/ML IJ SOLN
1.0000 mg | INTRAMUSCULAR | Status: DC | PRN
  Administered 2019-03-05 – 2019-03-06 (×4): 1 mg via INTRAVENOUS
  Filled 2019-03-05 (×4): qty 1

## 2019-03-05 MED ORDER — OXYTOCIN 40 UNITS IN NORMAL SALINE INFUSION - SIMPLE MED: 1.0000 m[IU]/min | INTRAVENOUS | Status: DC

## 2019-03-05 MED ORDER — ONDANSETRON HCL 4 MG/2ML IJ SOLN
4.0000 mg | Freq: Four times a day (QID) | INTRAMUSCULAR | Status: DC | PRN
  Administered 2019-03-05 – 2019-03-06 (×2): 4 mg via INTRAVENOUS
  Filled 2019-03-05 (×2): qty 2

## 2019-03-05 NOTE — Progress Notes (Signed)
  Labor Progress Note   24 y.o. G1P0 @ [redacted]w[redacted]d , admitted for  Pregnancy, Labor Management.   Subjective:  Some pains Pitocin 22 mU/min  Objective:  BP 133/76   Pulse 69   Temp 98 F (36.7 C) (Oral)   Resp 16   Ht 5\' 9"  (1.753 m)   Wt 104.8 kg   LMP 05/28/2018 (Within Days)   BMI 34.11 kg/m  Abd: gravid, ND, FHT present, mild tenderness on exam Extr: trace to 1+ bilateral pedal edema SVE: CERVIX: 2-3 cm dilated, 80 effaced, -3 station, presenting part VTX MEMBRANES: ruptured, clear fluid  EFM: FHR:   140 bpm, variability: moderate,  accelerations:  Present,  decelerations:  Absent Toco: Frequency: Every 5-8 minutes Labs: I have reviewed the patient's lab results.   Assessment & Plan:  G1P0 @ [redacted]w[redacted]d, admitted for  Pregnancy and Labor/Delivery Management  1. Pain management: none. 2. FWB: FHT category 1.  3. ID: GBS negative 4. Labor management: AROM clear Cont Pitocin Epidural when ready  All discussed with patient, see orders  [redacted]w[redacted]d, MD, Annamarie Major Ob/Gyn, [facility] 03/05/2019  3:15 PM

## 2019-03-05 NOTE — Progress Notes (Signed)
  Labor Progress Note   24 y.o. G1P0 @ [redacted]w[redacted]d , admitted for  Pregnancy, Labor Management.   Subjective:  Low back pain, some crampy abd pains  Objective:  BP 135/81   Pulse 81   Temp 97.8 F (36.6 C) (Oral)   Resp 14   Ht 5\' 9"  (1.753 m)   Wt 104.8 kg   LMP 05/28/2018 (Within Days)   BMI 34.11 kg/m  Abd: gravid, ND, FHT present, mild tenderness on exam Extr: trace to 1+ bilateral pedal edema SVE: CERVIX: 2-3 cm dilated, 80 effaced, -2 station, presenting part VTX MEMBRANES: intact  EFM: FHR: 140 bpm, variability: moderate,  accelerations:  Present,  decelerations:  Absent Toco: Frequency: Every 3-5 minutes Labs: I have reviewed the patient's lab results.   Assessment & Plan:  G1P0 @ [redacted]w[redacted]d, admitted for  Pregnancy and Labor/Delivery Management  1. Pain management: none. 2. FWB: FHT category 1.  3. ID: GBS negative 4. Labor management: Cont Ptocin AROM when appropriate Considering epidural options  All discussed with patient, see orders  [redacted]w[redacted]d, MD, Annamarie Major Ob/Gyn, [facility] 03/05/2019  10:12 AM

## 2019-03-05 NOTE — Progress Notes (Signed)
Plan to give patient a pitocin break, regular diet, ambulation, and rest per MD order.

## 2019-03-06 ENCOUNTER — Encounter: Payer: Self-pay | Admitting: Obstetrics & Gynecology

## 2019-03-06 DIAGNOSIS — O99324 Drug use complicating childbirth: Secondary | ICD-10-CM

## 2019-03-06 DIAGNOSIS — O480 Post-term pregnancy: Secondary | ICD-10-CM

## 2019-03-06 LAB — ABO/RH: ABO/RH(D): O POS

## 2019-03-06 MED ORDER — SODIUM CHLORIDE 0.9 % IV SOLN
2.0000 g | Freq: Once | INTRAVENOUS | Status: AC
  Administered 2019-03-06: 2 g via INTRAVENOUS

## 2019-03-06 MED ORDER — FERROUS SULFATE 325 (65 FE) MG PO TABS
325.0000 mg | ORAL_TABLET | Freq: Two times a day (BID) | ORAL | Status: DC
  Administered 2019-03-06 – 2019-03-08 (×5): 325 mg via ORAL
  Filled 2019-03-06 (×5): qty 1

## 2019-03-06 MED ORDER — WITCH HAZEL-GLYCERIN EX PADS: 1.0000 "application " | MEDICATED_PAD | CUTANEOUS | Status: DC | PRN

## 2019-03-06 MED ORDER — GENTAMICIN SULFATE 40 MG/ML IJ SOLN
1.5000 mg/kg | Freq: Three times a day (TID) | INTRAVENOUS | Status: DC
  Administered 2019-03-06: 160 mg via INTRAVENOUS
  Filled 2019-03-06 (×3): qty 4

## 2019-03-06 MED ORDER — ONDANSETRON HCL 4 MG/2ML IJ SOLN: 4.0000 mg | INTRAMUSCULAR | Status: DC | PRN

## 2019-03-06 MED ORDER — SENNOSIDES-DOCUSATE SODIUM 8.6-50 MG PO TABS
2.0000 | ORAL_TABLET | ORAL | Status: DC
  Filled 2019-03-06 (×2): qty 2

## 2019-03-06 MED ORDER — COCONUT OIL OIL
1.0000 "application " | TOPICAL_OIL | Status: DC | PRN
  Administered 2019-03-06: 1 via TOPICAL
  Filled 2019-03-06: qty 120

## 2019-03-06 MED ORDER — ONDANSETRON HCL 4 MG PO TABS: 4.0000 mg | ORAL_TABLET | ORAL | Status: DC | PRN

## 2019-03-06 MED ORDER — ACETAMINOPHEN 325 MG PO TABS
650.0000 mg | ORAL_TABLET | ORAL | Status: DC | PRN
  Administered 2019-03-06 – 2019-03-08 (×5): 650 mg via ORAL
  Filled 2019-03-06 (×5): qty 2

## 2019-03-06 MED ORDER — HYDROCODONE-ACETAMINOPHEN 5-325 MG PO TABS: 1.0000 | ORAL_TABLET | Freq: Four times a day (QID) | ORAL | Status: DC | PRN

## 2019-03-06 MED ORDER — IBUPROFEN 600 MG PO TABS
600.0000 mg | ORAL_TABLET | Freq: Four times a day (QID) | ORAL | Status: DC
  Administered 2019-03-06 – 2019-03-08 (×9): 600 mg via ORAL
  Filled 2019-03-06 (×9): qty 1

## 2019-03-06 MED ORDER — SIMETHICONE 80 MG PO CHEW: 80.0000 mg | CHEWABLE_TABLET | ORAL | Status: DC | PRN

## 2019-03-06 MED ORDER — PRENATAL MULTIVITAMIN CH
1.0000 | ORAL_TABLET | Freq: Every day | ORAL | Status: DC
  Administered 2019-03-06 – 2019-03-08 (×3): 1 via ORAL
  Filled 2019-03-06 (×3): qty 1

## 2019-03-06 MED ORDER — DIPHENHYDRAMINE HCL 25 MG PO CAPS: 25.0000 mg | ORAL_CAPSULE | Freq: Four times a day (QID) | ORAL | Status: DC | PRN

## 2019-03-06 MED ORDER — BENZOCAINE-MENTHOL 20-0.5 % EX AERO
1.0000 "application " | INHALATION_SPRAY | CUTANEOUS | Status: DC | PRN
  Administered 2019-03-06: 1 via TOPICAL
  Filled 2019-03-06: qty 56

## 2019-03-06 MED ORDER — DIBUCAINE (PERIANAL) 1 % EX OINT: 1.0000 "application " | TOPICAL_OINTMENT | CUTANEOUS | Status: DC | PRN

## 2019-03-06 MED ORDER — SODIUM CHLORIDE 0.9 % IV SOLN
1.0000 g | INTRAVENOUS | Status: DC
  Filled 2019-03-06 (×4): qty 1000

## 2019-03-06 MED ORDER — SODIUM CHLORIDE 0.9 % IV SOLN
INTRAVENOUS | Status: AC
  Filled 2019-03-06: qty 2000

## 2019-03-06 NOTE — Lactation Note (Signed)
This note was copied from a baby's chart. Lactation Consultation Note  Patient Name: Ruth Griffith Date: 03/06/2019 Reason for consult: Mother's request;Primapara;Term;Other (Comment)(Mom still needing assistance with positioning & latching )  Assisted mom with positioning and latching [name] to the breast for several feedings.  We have latched [name] in football, cradle and cross cradle positions using mom's nursing pillow.  The struggle is to get [name]'s mouth wide open with good flanged lips.  She has a tendency to suck in lower lip.  Once we get flanged lips for deeper latch, she can sustain the latch for long interval.  Mom recognizes when lower lip is not flanged outward and/or when she has a shallow latch because it is painful.  She knows to break suction and get deeper latch.  Hand expression of colostrum demonstrated and encouraged to rub on nipples to prevent bacteria, lubricate and help with tenderness.  Mom already has coconut oil.  Comfort gels given with instructions to alternate use.    Demonstrated how to massage breast to keep her actively sucking at the breast.  Explained feeding cues and encouraged mom to put [name] to the breast whenever she demonstrated hunger cues.  Initially mom was pulling back on her breast and losing a good seal at the breast to make breathing room.  Demonstrated how to not pull back but to press toward her mouth if she was having difficulty breathing and coming on and off the breast to breath. Mom was concerned that [place] had not voided yet.  Explained that we do not expect but one void and one stool the first 24 hours and she is only 12 hours old and has had 3 bowel movements already.  Discussed normal newborn stomach size, supply and demand, normal course of lactation and routine newborn feeding patterns. With each breast feed that LC has assisted with, mom becomes more confident and independent.  Lactation name and number written on white board.   Lactation community resource hand out given with contact numbers and reviewed encouraging to call with any questions, concerns or any time assistance needed.      Maternal Data Formula Feeding for Exclusion: No Has patient been taught Hand Expression?: Yes Does the patient have breastfeeding experience prior to this delivery?: No(Gr1)  Feeding Feeding Type: Breast Fed  LATCH Score Latch: Repeated attempts needed to sustain latch, nipple held in mouth throughout feeding, stimulation needed to elicit sucking reflex.  Audible Swallowing: A few with stimulation  Type of Nipple: Everted at rest and after stimulation  Comfort (Breast/Nipple): Filling, red/small blisters or bruises, mild/mod discomfort  Hold (Positioning): Assistance needed to correctly position infant at breast and maintain latch.  LATCH Score: 6  Interventions Interventions: Assisted with latch;Breast massage;Hand express;Reverse pressure;Breast compression;Adjust position;Support pillows;Position options;Coconut oil;Comfort gels  Lactation Tools Discussed/Used Tools: Coconut oil;Comfort gels Shell Type: (Mom does not have shells) WIC Program: Yes   Consult Status Consult Status: PRN    Louis Meckel 03/06/2019, 10:17 PM

## 2019-03-06 NOTE — Discharge Summary (Addendum)
OB Discharge Summary     Patient Name: Ruth Griffith DOB: [birth year] MRN: [id number]  Date of admission: 03/04/2019 Delivering MD: Thomasene Mohair, MD  Date of Delivery: 03/06/2019  Date of discharge: 03/08/2019  Admitting diagnosis: Encounter for elective induction of labor [Z34.90] Intrauterine pregnancy: [redacted]w[redacted]d     Secondary diagnosis: None     Discharge diagnosis: Term Pregnancy Delivered ,                                                                                              Post partum procedures:none  Augmentation: AROM and Pitocin  Complications: None  Hospital course:  Induction of Labor With Vaginal Delivery   24 y.o. yo G1P0 at [redacted]w[redacted]d was admitted to the hospital 03/04/2019 for induction of labor.  Indication for induction: Postdates.  Patient had an uncomplicated labor course as follows: Membrane Rupture Time/Date: 3:12 PM ,03/05/2019   Intrapartum Procedures: Episiotomy: None [1]                                         Lacerations:  1st degree [2];Labial [10];Vaginal [6]  Patient had delivery of a Viable infant.  Information for the patient's newborn:  Mertie, [place] [[id number]]  Delivery Method: Vag-Spont    03/06/2019  Details of delivery can be found in separate delivery note.  Patient had a routine postpartum course. She had a postpartum hemoglobin of 8.2 gm/dl but was asymptomatic of the anemia. Patient is discharged home 03/08/19.  Physical exam  Vitals:   03/07/19 0757 03/07/19 1539 03/08/19 0053 03/08/19 1043  BP: 125/86 117/68 134/83 124/80  Pulse: 86 88 96 85  Resp: 18 20 18 18   Temp: 97.8 F (36.6 C) 98.7 F (37.1 C) 98.5 F (36.9 C) 98.3 F (36.8 C)  TempSrc: Oral Oral Oral Oral  SpO2: 99%  100% 100%  Weight:      Height:       General: alert, cooperative and no distress. Complains of upper left chest wall pain, made worse by abducting arm to the side. No increased pain with deep breath and NT to palpation. Denies  lightheadedness Lochia: appropriate Uterine Fundus: firm/ U-1/ ML/ NT DVT Evaluation: No evidence of DVT seen on physical exam.  Labs: Lab Results  Component Value Date   WBC 15.8 (H) 03/07/2019   HGB 8.2 (L) 03/07/2019   HCT 26.3 (L) 03/07/2019   MCV 85.9 03/07/2019   PLT 192 03/07/2019    Discharge instruction: per After Visit Summary.  Medications:  Allergies as of 03/08/2019   No Known Allergies     Medication List    TAKE these medications   docusate sodium 100 MG capsule Commonly known as: Colace Take 1 capsule (100 mg total) by mouth daily as needed for mild constipation.   ferrous sulfate 325 (65 FE) MG tablet Take 1 tablet (325 mg total) by mouth 2 (two) times daily with a meal.   ibuprofen 600 MG tablet Commonly known as: ADVIL Take 1 tablet (600 mg total) by  mouth every 6 (six) hours.   multivitamin-prenatal 27-0.8 MG Tabs tablet Take 1 tablet by mouth daily at 12 noon.       Diet: routine diet  Activity: Advance as tolerated. Pelvic rest for 6 weeks.   Outpatient follow up: Follow-up Information    Dalia Heading, CNM. Schedule an appointment as soon as possible for a visit.   Specialty: Certified Nurse Midwife Why: for a mood check/ follow up chest wall pain in [facility] weeks at [facility] information: [place] [address] [phone]             Postpartum contraception: Paragard IUD Rhogam Given postpartum: no Rubella vaccine given postpartum: no Varicella vaccine given postpartum: no TDaP given antepartum or postpartum: 01/13/2019  Newborn Data: Live born female  [place] Birth Weight:  7#11.8oz APGAR: 4, 9  Newborn Delivery   Birth date/time: 03/06/2019 08:54:00 Delivery type: Vaginal, Spontaneous       Baby Feeding: Breast  Disposition:home with mother  SIGNED: Dalia Heading, [place], [facility] 03/08/2019, 11:14 AM

## 2019-03-06 NOTE — Progress Notes (Signed)
  Labor Progress Note   24 y.o. G1P0 @ [redacted]w[redacted]d , admitted for  Pregnancy, Labor Management.   Subjective:  Mild pain, Stadol helps  No s/sx preeclampsia  Objective:  BP (!) 145/75   Pulse 80   Temp 98.1 F (36.7 C) (Oral)   Resp 20   Ht 5\' 9"  (1.753 m)   Wt 104.8 kg   LMP 05/28/2018 (Within Days)   BMI 34.11 kg/m  Abd: gravid, ND, FHT present, mild tenderness on exam Extr: trace to 1+ bilateral pedal edema SVE: CERVIX: 7 cm dilated, 80 effaced, 0 station  EFM: FHR: 140 bpm, variability: moderate,  accelerations:  Present,  decelerations:  Absent Toco: Frequency: Every 3-4 minutes Pitocin 16 mU/min Labs: I have reviewed the patient's lab results.   Assessment & Plan:  G1P0 @ [redacted]w[redacted]d, admitted for  Pregnancy and Labor/Delivery Management  1. Pain management: IV sedation. 2. FWB: FHT category 1.  3. ID: GBS negative 4. Labor management: IUPC placed, and progress slow but progressive ABX for low grade temp and AROM 15+ hours ago Pitocin to continue Pt does not want epidural  All discussed with patient, see orders  [redacted]w[redacted]d, MD, Annamarie Major Ob/Gyn, [facility] 03/06/2019  6:51 AM

## 2019-03-06 NOTE — Discharge Instructions (Signed)

## 2019-03-07 LAB — CBC
HCT: 26.3 % — ABNORMAL LOW (ref 36.0–46.0)
Hemoglobin: 8.2 g/dL — ABNORMAL LOW (ref 12.0–15.0)
MCH: 26.8 pg (ref 26.0–34.0)
MCHC: 31.2 g/dL (ref 30.0–36.0)
MCV: 85.9 fL (ref 80.0–100.0)
Platelets: 192 10*3/uL (ref 150–400)
RBC: 3.06 MIL/uL — ABNORMAL LOW (ref 3.87–5.11)
RDW: 15.7 % — ABNORMAL HIGH (ref 11.5–15.5)
WBC: 15.8 10*3/uL — ABNORMAL HIGH (ref 4.0–10.5)
nRBC: 0 % (ref 0.0–0.2)

## 2019-03-07 MED ORDER — IBUPROFEN 600 MG PO TABS: 600.0000 mg | ORAL_TABLET | Freq: Four times a day (QID) | ORAL | 0 refills | Status: DC

## 2019-03-07 NOTE — Lactation Note (Signed)
This note was copied from a baby's chart. Lactation Consultation Note  Patient Name: Ruth Griffith [id number] Date: 03/07/2019 Reason for consult: Follow-up assessment  LC student introduced to mom and how things were going. Per mom, baby had just BF at 9:55 for twenty minutes successfully. Mom states that feedings are going well on both sides with minimal soreness at initial latch. LC student advised mom to express breast milk after feeding and apply to sore nipples to help with soreness.  Mom states that she is left handed and finds feeding on the left side goes well with cross-cradle position/ football hold on the righft. Advised mom to allow [place] to observe next feeding session based on baby's high bilirubin. Also advised mom to feed baby on demand frequently, based on feeding cues and to rest while baby is resting after she expressed tiredness.  Mom given LC contact phone number and name on the whiteboard.  Maternal Data Formula Feeding for Exclusion: No Does the patient have breastfeeding experience prior to this delivery?: No  Feeding Feeding Type: Breast Fed  LATCH Score                   Interventions Interventions: Expressed milk;Breast feeding basics reviewed  Lactation Tools Discussed/Used     Consult Status Consult Status: Follow-up Date: 03/07/19 Follow-up type: In-patient    Danford Bad 03/07/2019, 11:11 AM

## 2019-03-07 NOTE — Lactation Note (Signed)
This note was copied from a baby's chart. Lactation Consultation Note  Patient Name: Ruth Griffith [id number]'J Date: 03/07/2019 Reason for consult: Follow-up assessment  LC student observed/assisted with putting baby to breast on side that mom has had more difficulty with. Baby latched easily and began strong rhythmic sucking, no pain or discomfort noted. LC student provided breastfeeding basics education to include: newborn stomach size, feeding patterns, growth spurts/cluster feeding, onset of mature milk, milk supply and demand.  Information given for outpatient lactation support and community breastfeeding resources in anticipation for discharge later today. Encouraged to call out as needed for support before discharge.  Maternal Data Formula Feeding for Exclusion: No Does the patient have breastfeeding experience prior to this delivery?: Yes  Feeding Feeding Type: Breast Fed  LATCH Score                   Interventions Interventions: Breast feeding basics reviewed  Lactation Tools Discussed/Used     Consult Status Consult Status: Follow-up Date: 03/07/19 Follow-up type: In-patient    Danford Bad 03/07/2019, 4:33 PM

## 2019-03-08 MED ORDER — DOCUSATE SODIUM 100 MG PO CAPS: 100.0000 mg | ORAL_CAPSULE | Freq: Every day | ORAL | 2 refills | Status: DC | PRN

## 2019-03-08 MED ORDER — FERROUS SULFATE 325 (65 FE) MG PO TABS: 325.0000 mg | ORAL_TABLET | Freq: Two times a day (BID) | ORAL | 1 refills | Status: DC

## 2019-03-08 NOTE — Progress Notes (Signed)
Patient discharged. Will room in with infant tonight. Infant under bili lights. Discharge instructions, prescriptions and follow up appointment given to and reviewed with patient. Patient verbalized understanding

## 2019-03-08 NOTE — Lactation Note (Signed)
This note was copied from a baby's chart. Lactation Consultation Note  Patient Name: Ruth Griffith VHQIO'N Date: 03/08/2019    [facility] student entered room for follow-up assessment. Upon entering room, mother of baby was awake and father of baby was sleeping. Baby was in bassinet, with phototherapy getting phototherapy.   Mother of baby reports that she has not tried to bring baby to breast since early in the am, and that the last feed was formula. [facility] student provided a review of breastfeeding basics and ensured mother of baby that more stimulation will bring more milk. When [facility] student asked mother of baby about pump usage, and if she had a pump at home, mother of baby reported that she does have a Spectra DEBP at home, and that she does have a hand pump. LC encouraged mother of baby to bring baby to breast, as baby starts cuing, as well as pump for breast stimulation. [place] asked mother of baby she would like to be set up with a DEBP while still in the hospital, and mother of baby agreed.   [facility] student got pump parts and attempted to set up mother of baby with DEBP, and father of baby stated that they would like to wait until they go home to start pumping. [facility] student asked if it would be okay to teach both parents how to properly use the hand pump that comes in the DEBP kit, they stated that was fine. [facility] student went over hand pump usage.   [facility] student reviewed breastfeeding basics and expected infant output in the coming days. [facility] student encouraged both parents to watch for hunger cues and feed baby on demand. Skin to skin encouraged, as well. Outpatient lactation information given, [place] student encouraged parents to reach out for continued support if needed.    Maternal Data    Feeding Feeding Type: Breast Fed  LATCH Score Latch: Grasps breast easily, tongue down, lips flanged, rhythmical sucking.  Audible Swallowing: Spontaneous and intermittent  Type of Nipple: Everted at rest and after  stimulation  Comfort (Breast/Nipple): Soft / non-tender  Hold (Positioning): Assistance needed to correctly position infant at breast and maintain latch.  LATCH Score: 9  Interventions Interventions: Breast feeding basics reviewed;Hand pump;Skin to skin  Lactation Tools Discussed/Used     Consult Status      Saddie Benders, Lactation Student 03/08/2019, 4:07 PM

## 2019-03-16 ENCOUNTER — Ambulatory Visit: Payer: Medicaid Other | Admitting: Obstetrics and Gynecology

## 2019-03-24 ENCOUNTER — Ambulatory Visit (INDEPENDENT_AMBULATORY_CARE_PROVIDER_SITE_OTHER): Payer: Medicaid Other | Admitting: Certified Nurse Midwife

## 2019-03-24 ENCOUNTER — Other Ambulatory Visit: Payer: Self-pay

## 2019-03-24 ENCOUNTER — Telehealth: Payer: Self-pay | Admitting: Obstetrics and Gynecology

## 2019-03-24 ENCOUNTER — Encounter: Payer: Self-pay | Admitting: Certified Nurse Midwife

## 2019-03-24 VITALS — BP 134/80 | HR 66 | Temp 94.2°F | Ht 69.0 in | Wt 207.0 lb

## 2019-03-24 DIAGNOSIS — B9689 Other specified bacterial agents as the cause of diseases classified elsewhere: Secondary | ICD-10-CM | POA: Diagnosis not present

## 2019-03-24 DIAGNOSIS — N760 Acute vaginitis: Secondary | ICD-10-CM | POA: Diagnosis not present

## 2019-03-24 DIAGNOSIS — N898 Other specified noninflammatory disorders of vagina: Secondary | ICD-10-CM

## 2019-03-24 DIAGNOSIS — Z3009 Encounter for other general counseling and advice on contraception: Secondary | ICD-10-CM

## 2019-03-24 DIAGNOSIS — Z1332 Encounter for screening for maternal depression: Secondary | ICD-10-CM

## 2019-03-24 MED ORDER — CLINDAMYCIN HCL 300 MG PO CAPS: 300.0000 mg | ORAL_CAPSULE | Freq: Two times a day (BID) | ORAL | 0 refills | Status: AC

## 2019-03-24 NOTE — Telephone Encounter (Signed)
Noted. Paragard reserved for this patient. 

## 2019-03-24 NOTE — Telephone Encounter (Signed)
Patient scheduled 3/15 for 6 wk PP/Paragard placement with SDJ/KP.

## 2019-03-26 ENCOUNTER — Encounter: Payer: Self-pay | Admitting: Certified Nurse Midwife

## 2019-03-26 NOTE — Progress Notes (Signed)
Postpartum Visit  Chief Complaint:  Chief Complaint  Patient presents with  . Gynecologic Exam    odor, d/c off white    History of Present Illness: Ruth Griffith is a 24 y.o. G1P1001 who presents for a 2 week postpartum  mood check. She has a history of bipolar disorder. She has been doing well and baby is breast feeding. She complains of a vaginal odor since her bleeding has slowed. Denis vulvar itching or irritation.   Date of delivery: 03/06/2019 Type of delivery: Vaginal delivery - Vacuum or forceps assisted  no Episiotomy No.  Laceration: yes, first degree vaginal, labial Pregnancy or labor problems:  no Any problems since the delivery:  yesanemia  Newborn Details:  SINGLETON :  1. Baby's name: Markus Daft. Birth weight: 7#12oz Maternal Details:  Breast Feeding:  yes Post partum depression/anxiety noted: a little concern [place] Post-Partum Depression Score:  6    Review of Systems: Review of Systems  Constitutional: Positive for weight loss. Negative for chills and fever.  HENT: Negative for congestion, sinus pain and sore throat.   Eyes: Negative for blurred vision and pain.  Respiratory: Negative for hemoptysis, shortness of breath and wheezing.   Cardiovascular: Negative for chest pain, palpitations and leg swelling.  Gastrointestinal: Positive for diarrhea. Negative for abdominal pain, blood in stool, heartburn, nausea and vomiting.  Genitourinary: Negative for dysuria, frequency, hematuria and urgency.       Positive for vaginal discharge with odor  Musculoskeletal: Negative for back pain, joint pain and myalgias.  Skin: Negative for itching and rash.  Neurological: Negative for dizziness, tingling and headaches.  Endo/Heme/Allergies: Negative for environmental allergies and polydipsia. Does not bruise/bleed easily.       Negative for hirsutism   Psychiatric/Behavioral: Negative for depression. The patient is not nervous/anxious and does not have  insomnia.     Past Medical History:  Past Medical History:  Diagnosis Date  . Bipolar 1 disorder ([place])   . Cannabis use disorder, moderate, dependence ([place]) 11/30/2016  . Dermatitis, seborrheic    also known as Tinea or Pityriasis versicolor (fungal skin infection )  . Tear of meniscus of left knee     Past Surgical History:  Past Surgical History:  Procedure Laterality Date  . KNEE ARTHROSCOPY WITH MEDIAL MENISECTOMY Left 01/30/2014   Procedure: LEFT KNEE ARTHROSCOPY WITH PLICA RESECTION;  Surgeon: Sydnee Cabal, MD;  Location: [facility];  Service: Orthopedics;  Laterality: Left;    Family History:  No family history on file.  Social History:  Social History   Socioeconomic History  . Marital status: Single    Spouse name: Not on file  . Number of children: 1  . Years of education: Not on file  . Highest education level: Not on file  Occupational History  . Not on file  Tobacco Use  . Smoking status: Passive Smoke Exposure - Never Smoker  . Smokeless tobacco: Never Used  Substance and Sexual Activity  . Alcohol use: Not Currently  . Drug use: Not Currently    Types: Marijuana    Comment: Unknown related to AMS  . Sexual activity: Not Currently    Birth control/protection: None    Comment: Unknown  Other Topics Concern  . Not on file  Social History Narrative   ** Merged History Encounter **       Father smokes.  Lives with both parents.  No family anesthesia problems     Social Determinants of  Health   Financial Resource Strain:   . Difficulty of Paying Living Expenses: Not on file  Food Insecurity:   . Worried About Programme researcher, broadcasting/film/video in the Last Year: Not on file  . Ran Out of Food in the Last Year: Not on file  Transportation Needs:   . Lack of Transportation (Medical): Not on file  . Lack of Transportation (Non-Medical): Not on file  Physical Activity:   . Days of Exercise per Week: Not on file  . Minutes of Exercise per  Session: Not on file  Stress:   . Feeling of Stress : Not on file  Social Connections:   . Frequency of Communication with Friends and Family: Not on file  . Frequency of Social Gatherings with Friends and Family: Not on file  . Attends Religious Services: Not on file  . Active Member of Clubs or Organizations: Not on file  . Attends Banker Meetings: Not on file  . Marital Status: Not on file  Intimate Partner Violence:   . Fear of Current or Ex-Partner: Not on file  . Emotionally Abused: Not on file  . Physically Abused: Not on file  . Sexually Abused: Not on file    Allergies:  No Known Allergies  Medications: Prior to Admission medications   Medication Sig Start Date End Date Taking? Authorizing Provider  docusate sodium (COLACE) 100 MG capsule Take 1 capsule (100 mg total) by mouth daily as needed for mild constipation. 03/08/19 03/07/20 Yes Farrel Conners, CNM  ferrous sulfate 325 (65 FE) MG tablet Take 1 tablet (325 mg total) by mouth 2 (two) times daily with a meal. 03/08/19  Yes Farrel Conners, CNM  ibuprofen (ADVIL) 600 MG tablet Take 1 tablet (600 mg total) by mouth every 6 (six) hours. 03/07/19  Yes Vena Austria, MD  Prenatal Vit-Fe Fumarate-FA (MULTIVITAMIN-PRENATAL) 27-0.8 MG TABS tablet Take 1 tablet by mouth daily at 12 noon. 10/27/18  Yes Nadara Mustard, MD           Physical Exam Vitals: BP 134/80   Pulse 66   Temp (!) 94.2 F (34.6 C)   Ht 5\' 9"  (1.753 m)   Wt 207 lb (93.9 kg)   LMP  (LMP Unknown)   BMI 30.57 kg/m     General: BF in NAD, presents with baby who is sleeping . Genitourinary:  External:, no lesions or inflammation    Vagina: Normal vaginal mucosa, blood tinged discharge    Extremities: no edema, erythema, or tenderness  Neurologic: Grossly intact  Psychiatric: mood appropriate, affect full Wet prep positive for clue cells, negative for hyphae and Trich  Assessment: 24 y.o. G1P1001 with bacterial vaginosis Coping  well postpartum  Plan:   1) Contraception Education: Patient interested in Paragard Discussed risks of expulsion, perforation, bleeding irregularities, and infection. Explained insertion procedure. To schedule for her 6 week pp check   2) Patient underwent screening for postpartum depression with no concerns noted.  3) Clindamycin 300 mgm BID x 7 days  4) Follow up 4 weeks for her 6 week pp check and IUD insertion

## 2019-04-17 ENCOUNTER — Ambulatory Visit (INDEPENDENT_AMBULATORY_CARE_PROVIDER_SITE_OTHER): Payer: Medicaid Other | Admitting: Obstetrics and Gynecology

## 2019-04-17 ENCOUNTER — Encounter: Payer: Self-pay | Admitting: Obstetrics and Gynecology

## 2019-04-17 ENCOUNTER — Other Ambulatory Visit (HOSPITAL_COMMUNITY)
Admission: RE | Admit: 2019-04-17 | Discharge: 2019-04-17 | Disposition: A | Discharge status: Home / Self Care | Payer: Medicaid Other | Source: Ambulatory Visit | Attending: Obstetrics and Gynecology | Admitting: Obstetrics and Gynecology

## 2019-04-17 ENCOUNTER — Other Ambulatory Visit: Payer: Self-pay

## 2019-04-17 DIAGNOSIS — Z30430 Encounter for insertion of intrauterine contraceptive device: Secondary | ICD-10-CM

## 2019-04-17 DIAGNOSIS — Z113 Encounter for screening for infections with a predominantly sexual mode of transmission: Secondary | ICD-10-CM | POA: Diagnosis present

## 2019-04-17 MED ORDER — PARAGARD INTRAUTERINE COPPER IU IUD: 1.0000 | INTRAUTERINE_SYSTEM | Freq: Once | INTRAUTERINE | 0 refills | Status: DC

## 2019-04-17 NOTE — Progress Notes (Signed)
Postpartum Visit   Chief Complaint  Patient presents with  . Postpartum Care   History of Present Illness: Patient is a 24 y.o. G1P1001 presents for postpartum visit.  Date of delivery: 03/06/2019 Type of delivery: Vaginal delivery - Vacuum or forceps assisted  no Episiotomy No.  Laceration: 1st degree Pregnancy or labor problems:  no Any problems since the delivery:  no  Newborn Details:  SINGLETON :  1. Baby's name: Lane Hacker. Birth weight: 7.12lb Maternal Details:  Breast Feeding:  Breast Post partum depression/anxiety noted:  no [place] Post-Partum Depression Score:  2  Date of last PAP: 08/08/2018- NORMAL  Past Medical History:  Diagnosis Date  . Bipolar 1 disorder ([facility])   . Cannabis use disorder, moderate, dependence ([facility]) 11/30/2016  . Dermatitis, seborrheic    also known as Tinea or Pityriasis versicolor (fungal skin infection )  . Tear of meniscus of left knee     Past Surgical History:  Procedure Laterality Date  . KNEE ARTHROSCOPY WITH MEDIAL MENISECTOMY Left 01/30/2014   Procedure: LEFT KNEE ARTHROSCOPY WITH PLICA RESECTION;  Surgeon: Eugenia Mcalpine, MD;  Location: [facility];  Service: Orthopedics;  Laterality: Left;    Prior to Admission medications   Medication Sig Start Date End Date Taking? Authorizing Provider  ferrous sulfate 325 (65 FE) MG tablet Take 1 tablet (325 mg total) by mouth 2 (two) times daily with a meal. 03/08/19  Yes Farrel Conners, CNM  ibuprofen (ADVIL) 600 MG tablet Take 1 tablet (600 mg total) by mouth every 6 (six) hours. 03/07/19  Yes Vena Austria, MD  docusate sodium (COLACE) 100 MG capsule Take 1 capsule (100 mg total) by mouth daily as needed for mild constipation. Patient not taking: Reported on 04/17/2019 03/08/19 03/07/20  Farrel Conners, CNM  Prenatal Vit-Fe Fumarate-FA (MULTIVITAMIN-PRENATAL) 27-0.8 MG TABS tablet Take 1 tablet by mouth daily at 12 noon. Patient not taking: Reported on 04/17/2019 10/27/18    Nadara Mustard, MD   Allergies: No Known Allergies   Social History   Socioeconomic History  . Marital status: Single    Spouse name: Not on file  . Number of children: 1  . Years of education: Not on file  . Highest education level: Not on file  Occupational History  . Not on file  Tobacco Use  . Smoking status: Passive Smoke Exposure - Never Smoker  . Smokeless tobacco: Never Used  Substance and Sexual Activity  . Alcohol use: Not Currently  . Drug use: Not Currently    Types: Marijuana    Comment: Unknown related to AMS  . Sexual activity: Not Currently    Birth control/protection: None    Comment: Unknown  Other Topics Concern  . Not on file  Social History Narrative   ** Merged History Encounter **       Father smokes.  Lives with both parents.  No family anesthesia problems     Social Determinants of Health   Financial Resource Strain:   . Difficulty of Paying Living Expenses:   Food Insecurity:   . Worried About Programme researcher, broadcasting/film/video in the Last Year:   . Barista in the Last Year:   Transportation Needs:   . Freight forwarder (Medical):   Marland Kitchen Lack of Transportation (Non-Medical):   Physical Activity:   . Days of Exercise per Week:   . Minutes of Exercise per Session:   Stress:   . Feeling of Stress :   Social Connections:   .  Frequency of Communication with Friends and Family:   . Frequency of Social Gatherings with Friends and Family:   . Attends Religious Services:   . Active Member of Clubs or Organizations:   . Attends Archivist Meetings:   Marland Kitchen Marital Status:   Intimate Partner Violence:   . Fear of Current or Ex-Partner:   . Emotionally Abused:   Marland Kitchen Physically Abused:   . Sexually Abused:    Family History: denies history of gynecologic cancer  Review of Systems  Constitutional: Negative.   HENT: Negative.   Eyes: Negative.   Respiratory: Negative.   Cardiovascular: Negative.   Gastrointestinal: Negative.     Genitourinary: Negative.   Musculoskeletal: Negative.   Skin: Negative.   Neurological: Negative.   Psychiatric/Behavioral: Negative.      Physical Exam BP 124/78   Ht 5\' 9"  (1.753 m)   Wt 204 lb (92.5 kg)   LMP 04/16/2019   BMI 30.13 kg/m   Physical Exam Constitutional:      General: She is not in acute distress.    Appearance: Normal appearance. She is well-developed.  Genitourinary:     Pelvic exam was performed with patient in the lithotomy position.     Vulva, inguinal canal, urethra, vagina, cervix, uterus, right adnexa and left adnexa normal.     Uterus is midaxial and regular.  HENT:     Head: Normocephalic and atraumatic.  Eyes:     General: No scleral icterus.    Conjunctiva/sclera: Conjunctivae normal.  Cardiovascular:     Rate and Rhythm: Normal rate and regular rhythm.     Heart sounds: No murmur. No friction rub. No gallop.   Pulmonary:     Effort: Pulmonary effort is normal. No respiratory distress.     Breath sounds: Normal breath sounds. No wheezing or rales.  Abdominal:     General: Bowel sounds are normal. There is no distension.     Palpations: Abdomen is soft. There is no mass.     Tenderness: There is no abdominal tenderness. There is no guarding or rebound.  Musculoskeletal:        General: Normal range of motion.     Cervical back: Normal range of motion and neck supple.  Neurological:     General: No focal deficit present.     Mental Status: She is alert and oriented to person, place, and time.     Cranial Nerves: No cranial nerve deficit.  Skin:    General: Skin is warm and dry.     Findings: No erythema.  Psychiatric:        Mood and Affect: Mood normal.        Behavior: Behavior normal.        Judgment: Judgment normal.     IUD Insertion Procedure Note (Paragard) Patient identified, informed consent performed, consent signed.   Discussed risks of irregular bleeding, cramping, infection, malpositioning, expulsion or uterine  perforation of the IUD (1:1000 placements)  which may require further procedure such as laparoscopy.  IUD while effective at preventing pregnancy do not prevent transmission of sexually transmitted diseases and use of barrier methods for this purpose was discussed. Time out was performed.  Urine pregnancy not done as the patient is menstruating.  Speculum placed in the vagina.  Cervix visualized.  Cleaned with Betadine x 2.  Grasped anteriorly with a single tooth tenaculum.  Uterus sounded to 8 cm. IUD placed per manufacturer's recommendations.  Strings trimmed to 3 cm. Tenaculum was removed,  good hemostasis noted.  Patient tolerated procedure well.   Patient was given post-procedure instructions.  She was advised to have backup contraception for one week.  Patient was also asked to check IUD strings periodically and follow up in 4 weeks for IUD check.   Female Chaperone present during breast and/or pelvic exam.  Assessment: 24 y.o. G1P1001 presenting for 6 week postpartum visit  Plan: Problem List Items Addressed This Visit      Other   Postpartum care following vaginal delivery - Primary   Relevant Medications   paragard intrauterine copper IUD IUD   Other Relevant Orders   Cervicovaginal ancillary only    Other Visit Diagnoses    Screen for STD (sexually transmitted disease)       Relevant Orders   Cervicovaginal ancillary only   Encounter for IUD insertion       Relevant Medications   paragard intrauterine copper IUD IUD     1) Contraception Education given regarding options for contraception, including IUD placement.  2)  Pap - ASCCP guidelines and rational discussed.  Patient opts for routine screening interval. She is up to date.   3) Patient underwent screening for postpartum depression with no concerns noted.  Return in about 4 weeks (around 05/15/2019) for IUD String Check.   Thomasene Mohair, MD 04/17/2019 10:58 AM

## 2019-04-17 NOTE — Patient Instructions (Signed)
Intrauterine Device Insertion, Care After  This sheet gives you information about how to care for yourself after your procedure. Your health care provider may also give you more specific instructions. If you have problems or questions, contact your health care provider. What can I expect after the procedure? After the procedure, it is common to have:  Cramps and pain in the abdomen.  Light bleeding (spotting) or heavier bleeding that is like your menstrual period. This may last for up to a few days.  Lower back pain.  Dizziness.  Headaches.  Nausea. Follow these instructions at home:  Before resuming sexual activity, check to make sure that you can feel the IUD string(s). You should be able to feel the end of the string(s) below the opening of your cervix. If your IUD string is in place, you may resume sexual activity. ? If you had a hormonal IUD inserted more than 7 days after your most recent period started, you will need to use a backup method of birth control for 7 days after IUD insertion. Ask your health care provider whether this applies to you.  Continue to check that the IUD is still in place by feeling for the string(s) after every menstrual period, or once a month.  Take over-the-counter and prescription medicines only as told by your health care provider.  Do not drive or use heavy machinery while taking prescription pain medicine.  Keep all follow-up visits as told by your health care provider. This is important. Contact a health care provider if:  You have bleeding that is heavier or lasts longer than a normal menstrual cycle.  You have a fever.  You have cramps or abdominal pain that get worse or do not get better with medicine.  You develop abdominal pain that is new or is not in the same area of earlier cramping and pain.  You feel lightheaded or weak.  You have abnormal or bad-smelling discharge from your vagina.  You have pain during sexual  activity.  You have any of the following problems with your IUD string(s): ? The string bothers or hurts you or your sexual partner. ? You cannot feel the string. ? The string has gotten longer.  You can feel the IUD in your vagina.  You think you may be pregnant, or you miss your menstrual period.  You think you may have an STI (sexually transmitted infection). Get help right away if:  You have flu-like symptoms.  You have a fever and chills.  You can feel that your IUD has slipped out of place. Summary  After the procedure, it is common to have cramps and pain in the abdomen. It is also common to have light bleeding (spotting) or heavier bleeding that is like your menstrual period.  Continue to check that the IUD is still in place by feeling for the string(s) after every menstrual period, or once a month.  Keep all follow-up visits as told by your health care provider. This is important.  Contact your health care provider if you have problems with your IUD string(s), such as the string getting longer or bothering you or your sexual partner. This information is not intended to replace advice given to you by your health care provider. Make sure you discuss any questions you have with your health care provider. Document Revised: 01/01/2017 Document Reviewed: 12/11/2015 Elsevier Patient Education  2020 Elsevier Inc.  

## 2019-04-19 LAB — CERVICOVAGINAL ANCILLARY ONLY
Chlamydia: NEGATIVE
Comment: NEGATIVE
Comment: NEGATIVE
Comment: NORMAL
Neisseria Gonorrhea: NEGATIVE
Trichomonas: NEGATIVE

## 2019-04-20 NOTE — Telephone Encounter (Signed)
Paragard rcvd/charged 04/17/2019

## 2019-05-12 ENCOUNTER — Other Ambulatory Visit: Payer: Self-pay | Admitting: Obstetrics and Gynecology

## 2019-05-12 DIAGNOSIS — Z131 Encounter for screening for diabetes mellitus: Secondary | ICD-10-CM

## 2019-05-15 ENCOUNTER — Ambulatory Visit (INDEPENDENT_AMBULATORY_CARE_PROVIDER_SITE_OTHER): Payer: Medicaid Other | Admitting: Obstetrics and Gynecology

## 2019-05-15 ENCOUNTER — Encounter: Payer: Self-pay | Admitting: Obstetrics and Gynecology

## 2019-05-15 ENCOUNTER — Other Ambulatory Visit: Payer: Self-pay

## 2019-05-15 VITALS — BP 122/78 | Ht 68.0 in | Wt 193.0 lb

## 2019-05-15 DIAGNOSIS — Z30432 Encounter for removal of intrauterine contraceptive device: Secondary | ICD-10-CM

## 2019-05-15 NOTE — Progress Notes (Signed)
    IUD Removal  Patient identified, informed consent performed, consent signed.  Patient was in the dorsal lithotomy position, normal external genitalia was noted.  A speculum was placed in the patient's vagina, normal discharge was noted, no lesions. The cervix was visualized, no lesions, no abnormal discharge.  The strings of the IUD were grasped and pulled using ring forceps. The IUD was removed in its entirety. Patient tolerated the procedure well.    Patient will use nothing for contraception and she was told to avoid teratogens, take PNV and folic acid.  Routine preventative health maintenance measures emphasized.  Thomasene Mohair, MD, [facility], [facility] 05/15/2019 10:30 AM

## 2019-05-16 ENCOUNTER — Emergency Department
Admission: EM | Admit: 2019-05-16 | Discharge: 2019-05-17 | Disposition: A | Discharge status: Other Acute Inpatient Hospital | Payer: Medicaid Other | Attending: Emergency Medicine | Admitting: Emergency Medicine

## 2019-05-16 ENCOUNTER — Encounter: Payer: Self-pay | Admitting: Emergency Medicine

## 2019-05-16 ENCOUNTER — Other Ambulatory Visit: Payer: Self-pay

## 2019-05-16 DIAGNOSIS — Z20822 Contact with and (suspected) exposure to covid-19: Secondary | ICD-10-CM | POA: Diagnosis not present

## 2019-05-16 DIAGNOSIS — F3160 Bipolar disorder, current episode mixed, unspecified: Secondary | ICD-10-CM | POA: Insufficient documentation

## 2019-05-16 DIAGNOSIS — R451 Restlessness and agitation: Secondary | ICD-10-CM | POA: Diagnosis present

## 2019-05-16 DIAGNOSIS — Z7722 Contact with and (suspected) exposure to environmental tobacco smoke (acute) (chronic): Secondary | ICD-10-CM | POA: Diagnosis not present

## 2019-05-16 DIAGNOSIS — Z79899 Other long term (current) drug therapy: Secondary | ICD-10-CM | POA: Diagnosis not present

## 2019-05-16 LAB — URINE DRUG SCREEN, QUALITATIVE (ARMC ONLY)
Amphetamines, Ur Screen: NOT DETECTED
Barbiturates, Ur Screen: NOT DETECTED
Benzodiazepine, Ur Scrn: NOT DETECTED
Cannabinoid 50 Ng, Ur ~~LOC~~: NOT DETECTED
Cocaine Metabolite,Ur ~~LOC~~: NOT DETECTED
MDMA (Ecstasy)Ur Screen: NOT DETECTED
Methadone Scn, Ur: NOT DETECTED
Opiate, Ur Screen: NOT DETECTED
Phencyclidine (PCP) Ur S: NOT DETECTED
Tricyclic, Ur Screen: NOT DETECTED

## 2019-05-16 LAB — COMPREHENSIVE METABOLIC PANEL
ALT: 27 U/L (ref 0–44)
AST: 19 U/L (ref 15–41)
Albumin: 4.8 g/dL (ref 3.5–5.0)
Alkaline Phosphatase: 88 U/L (ref 38–126)
Anion gap: 9 (ref 5–15)
BUN: 6 mg/dL (ref 6–20)
CO2: 24 mmol/L (ref 22–32)
Calcium: 9.5 mg/dL (ref 8.9–10.3)
Chloride: 110 mmol/L (ref 98–111)
Creatinine, Ser: 0.68 mg/dL (ref 0.44–1.00)
GFR calc Af Amer: >60 mL/min (ref >60)
GFR calc non Af Amer: >60 mL/min (ref >60)
Glucose, Bld: 100 mg/dL — ABNORMAL HIGH (ref 70–99)
Potassium: 3.2 mmol/L — ABNORMAL LOW (ref 3.5–5.1)
Sodium: 143 mmol/L (ref 135–145)
Total Bilirubin: 0.8 mg/dL (ref 0.3–1.2)
Total Protein: 8.4 g/dL — ABNORMAL HIGH (ref 6.5–8.1)

## 2019-05-16 LAB — CBC
HCT: 40.4 % (ref 36.0–46.0)
Hemoglobin: 12.5 g/dL (ref 12.0–15.0)
MCH: 26.3 pg (ref 26.0–34.0)
MCHC: 30.9 g/dL (ref 30.0–36.0)
MCV: 85.1 fL (ref 80.0–100.0)
Platelets: 300 10*3/uL (ref 150–400)
RBC: 4.75 MIL/uL (ref 3.87–5.11)
RDW: 14.2 % (ref 11.5–15.5)
WBC: 9.8 10*3/uL (ref 4.0–10.5)
nRBC: 0 % (ref 0.0–0.2)

## 2019-05-16 LAB — POCT PREGNANCY, URINE: Preg Test, Ur: NEGATIVE

## 2019-05-16 LAB — LITHIUM LEVEL: Lithium Lvl: 1.48 mmol/L — ABNORMAL HIGH (ref 0.60–1.20)

## 2019-05-16 LAB — SALICYLATE LEVEL: Salicylate Lvl: <7 mg/dL — ABNORMAL LOW (ref 7.0–30.0)

## 2019-05-16 LAB — ETHANOL: Alcohol, Ethyl (B): <10 mg/dL (ref <10)

## 2019-05-16 LAB — ACETAMINOPHEN LEVEL: Acetaminophen (Tylenol), Serum: <10 ug/mL — ABNORMAL LOW (ref 10–30)
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
## 2019-05-16 NOTE — ED Notes (Signed)
Pt had baby Feb 1 this year.  C/o PPD and hx bipolar.  Has had manic episodes per pt last 4 days and difficulty sleeping even though baby sleeps well.  Denies SI/HI.  Would like to be admitted here even though has bed at [facility].

## 2019-05-16 NOTE — ED Notes (Signed)
Pt changed into burgundy scrubs by this NT. Pt's personal items placed in belongings bag are following: Grey and yellow striped blanket Grey slippers Yellow ring Grey shirt Black pants Tan bra Animal print hair-tie Black hair-tie Red underwear

## 2019-05-16 NOTE — ED Triage Notes (Signed)
Patient arrives with BPD under IVC.  States "I haven't been feeling well".  States she had a baby 2 months ago, first child.  States "I have bipolar disorder'.  States she had been feeling a lot of swings manic to depression in the past two weeks.  Denies SI/HI.  States has been off of medication for 1 1/2 years.  Had been taking lithium.  Patient is AAOx3.  Calm and cooperative in triage.

## 2019-05-16 NOTE — ED Notes (Signed)
Meal tray given to pt.

## 2019-05-16 NOTE — ED Notes (Signed)
Moved to BHU5 °

## 2019-05-16 NOTE — ED Provider Notes (Signed)
[facility] Emergency Department Provider Note ____________________________________________   First MD Initiated Contact with Patient 05/16/19 1516     (approximate)  I have reviewed the triage vital signs and the nursing notes.   HISTORY  Chief Complaint Mental Health Problem    HPI Ruth Griffith is a 24 y.o. female with PMH as noted below who presents under involuntary commitment due to erratic behavior and concern for mania.  Per the IVC paperwork, the patient has been agitated, not sleeping, pacing, and has been having mood swings.  She took a dose of lithium and Zyprexa today from her prior hospitalization, but otherwise has not been on any medication in the last 2 years.  The patient denies SI or HI.  She denies any acute medical complaints.    Past Medical History:  Diagnosis Date  . Bipolar 1 disorder (HCC)   . Cannabis use disorder, moderate, dependence (HCC) 11/30/2016  . Dermatitis, seborrheic    also known as Tinea or Pityriasis versicolor (fungal skin infection )  . Tear of meniscus of left knee     Patient Active Problem List   Diagnosis Date Noted  . Normal vaginal delivery 03/08/2019  . Postpartum care following vaginal delivery 03/08/2019  . Encounter for elective induction of labor 03/04/2019  . Supervision of other normal pregnancy, antepartum 08/10/2018  . Bipolar I disorder, most recent episode mixed, severe with psychotic features (HCC) 11/30/2016    Past Surgical History:  Procedure Laterality Date  . KNEE ARTHROSCOPY WITH MEDIAL MENISECTOMY Left 01/30/2014   Procedure: LEFT KNEE ARTHROSCOPY WITH PLICA RESECTION;  Surgeon: Eugenia Mcalpine, MD;  Location: [facility];  Service: Orthopedics;  Laterality: Left;    Prior to Admission medications   Medication Sig Start Date End Date Taking? Authorizing Provider  ferrous sulfate 325 (65 FE) MG tablet Take 1 tablet (325 mg total) by mouth 2 (two) times daily  with a meal. 03/08/19  Yes Farrel Conners, CNM  Prenatal Vit-Fe Fumarate-FA (MULTIVITAMIN-PRENATAL) 27-0.8 MG TABS tablet Take 1 tablet by mouth daily at 12 noon. 10/27/18  Yes Nadara Mustard, MD  docusate sodium (COLACE) 100 MG capsule Take 1 capsule (100 mg total) by mouth daily as needed for mild constipation. Patient not taking: Reported on 04/17/2019 03/08/19 03/07/20  Farrel Conners, CNM    Allergies Patient has no known allergies.  No family history on file.  Social History Social History   Tobacco Use  . Smoking status: Passive Smoke Exposure - Never Smoker  . Smokeless tobacco: Never Used  Substance Use Topics  . Alcohol use: Not Currently  . Drug use: Not Currently    Types: Marijuana    Comment: Unknown related to AMS    Review of Systems  Constitutional: No fever. Eyes: No redness. ENT: No sore throat. Cardiovascular: Denies chest pain. Respiratory: Denies shortness of breath. Gastrointestinal: No vomiting or diarrhea.  Genitourinary: Negative for dysuria.  Musculoskeletal: Negative for back pain. Skin: Negative for rash. Neurological: Negative for headache.   ____________________________________________   PHYSICAL EXAM:  VITAL SIGNS: ED Triage Vitals  Enc Vitals Group     BP 05/16/19 1417 137/86     Pulse Rate 05/16/19 1416 98     Resp 05/16/19 1416 16     Temp 05/16/19 1416 98.4 F (36.9 C)     Temp Source 05/16/19 1416 Oral     SpO2 05/16/19 1417 98 %     Weight 05/16/19 1416 195 lb (88.5 kg)  Height 05/16/19 1416 5\' 9"  (1.753 m)     Head Circumference --      Peak Flow --      Pain Score 05/16/19 1415 0     Pain Loc --      Pain Edu? --      Excl. in [place]? --     Constitutional: Alert and oriented. Well appearing and in no acute distress. Eyes: Conjunctivae are normal.  Head: Atraumatic. Nose: No congestion/rhinnorhea. Mouth/Throat: Mucous membranes are moist.   Neck: Normal range of motion.  Cardiovascular: Good peripheral  circulation. Respiratory: Normal respiratory effort.  No retractions.  Gastrointestinal: No distention.  Musculoskeletal: Extremities warm and well perfused.  Neurologic:  Normal speech and language. No gross focal neurologic deficits are appreciated.  Skin:  Skin is warm and dry. No rash noted. Psychiatric: Calm and cooperative.  ____________________________________________   LABS (all labs ordered are listed, but only abnormal results are displayed)  Labs Reviewed  COMPREHENSIVE METABOLIC PANEL - Abnormal; Notable for the following components:      Result Value   Potassium 3.2 (*)    Glucose, Bld 100 (*)    Total Protein 8.4 (*)    All other components within normal limits  SALICYLATE LEVEL - Abnormal; Notable for the following components:   Salicylate Lvl <1.0 (*)    All other components within normal limits  ACETAMINOPHEN LEVEL - Abnormal; Notable for the following components:   Acetaminophen (Tylenol), Serum <10 (*)    All other components within normal limits  LITHIUM LEVEL - Abnormal; Notable for the following components:   Lithium Lvl 1.48 (*)    All other components within normal limits  RESPIRATORY PANEL BY RT PCR (FLU A&B, COVID)  ETHANOL  CBC  URINE DRUG SCREEN, QUALITATIVE ([facility] ONLY)  POC URINE PREG, ED  POCT PREGNANCY, URINE   ____________________________________________  EKG   ____________________________________________  RADIOLOGY    ____________________________________________   PROCEDURES  Procedure(s) performed: No  Procedures  Critical Care performed: No ____________________________________________   INITIAL IMPRESSION / ASSESSMENT AND PLAN / ED COURSE  Pertinent labs & imaging results that were available during my care of the patient were reviewed by me and considered in my medical decision making (see chart for details).  24 year old female with PMH of bipolar disorder presents with erratic behavior and concern for a manic  episode.  She is under involuntary commitment.  The patient denies SI or HI.  She has no medical complaints.  She took a dose of lithium and Zyprexa prior to coming to the ED, however had not been on these medications for some time.  On exam, her vital signs are normal.  She is comfortable appearing and calm.  Physical exam is otherwise unremarkable.  Per the IVC paperwork, the patient apparently has been accepted at [facility] although apparently wants to be evaluated for admission here.  I have ordered TTS and psychiatry consultations.  Lab work-up is unremarkable except for an elevated lithium level, although this is consistent with her just having taken it once today.  ----------------------------------------- 9:39 PM on 05/16/2019 -----------------------------------------  Per Dr. Satira Sark from psychiatry, given that the patient already has a bed at [facility] she does not need psychiatry consultation in the ED here, so I have canceled it. ___________________________________  The patient has been placed in psychiatric observation due to the need to provide a safe environment for the patient while obtaining psychiatric consultation and evaluation, as well as ongoing medical and medication management to  treat the patient's condition.  The patient has been placed under full IVC at this time.  ____________________________________________   FINAL CLINICAL IMPRESSION(S) / ED DIAGNOSES  Final diagnoses:  Bipolar affective disorder, current episode mixed, current episode severity unspecified (HCC)      NEW MEDICATIONS STARTED DURING THIS VISIT:  New Prescriptions   No medications on file     Note:  This document was prepared using Dragon voice recognition software and may include unintentional dictation errors.    Dionne Bucy, MD 05/16/19 2315

## 2019-05-16 NOTE — ED Notes (Signed)
IVC prior to arrival/ Pt Accepted to [facility] [place] pending Negative Covid test results

## 2019-05-16 NOTE — ED Notes (Signed)
Pt. Alert and oriented, warm and dry, in no distress. Pt. Denies SI, HI, and AVH. Pt. Encouraged to let nursing staff know of any concerns or needs. 

## 2019-05-16 NOTE — ED Notes (Signed)
IVC PENDING  CONSULT ?

## 2019-05-16 NOTE — ED Notes (Signed)

## 2019-05-16 NOTE — ED Notes (Signed)
Pt speaking with her mother on the phone 

## 2019-05-17 DIAGNOSIS — F3160 Bipolar disorder, current episode mixed, unspecified: Secondary | ICD-10-CM | POA: Insufficient documentation

## 2019-05-17 LAB — RESPIRATORY PANEL BY RT PCR (FLU A&B, COVID)
Influenza A by PCR: NEGATIVE
Influenza B by PCR: NEGATIVE
SARS Coronavirus 2 by RT PCR: NEGATIVE

## 2019-05-17 MED ORDER — DIPHENHYDRAMINE HCL 25 MG PO CAPS
25.0000 mg | ORAL_CAPSULE | Freq: Once | ORAL | Status: DC
  Filled 2019-05-17: qty 1

## 2019-05-17 MED ORDER — IBUPROFEN 600 MG PO TABS
600.0000 mg | ORAL_TABLET | Freq: Once | ORAL | Status: AC
  Administered 2019-05-17: 600 mg via ORAL

## 2019-05-17 NOTE — ED Notes (Signed)
Hourly rounding reveals patient in bathroom. No complaints, stable, in no acute distress. Q15 minute rounds and monitoring via Psychologist, counselling to continue.

## 2019-05-17 NOTE — ED Notes (Signed)
Patient discharged home to wife, patient received discharge papers. Patient got belongings and verbalized he has received all of his belongings. Patient appropriate and cooperative, Denies SI/HI AVH. Vital signs taken. NAD noted.

## 2019-05-17 NOTE — ED Notes (Signed)
Patient's mother called and had questions about patients care, nurse informed her that she had to speak with patient, mother and patient spoke and patient allowed for [place] to give mother any information about her care

## 2019-05-17 NOTE — Consult Note (Signed)
[facility] Face-to-Face Psychiatry Consult   Reason for Consult:  bipolar disorder Referring Physician:  ED MD Patient Identification: Ruth Griffith MRN:  [id number] Principal Diagnosis: <principal problem not specified> Diagnosis:  Active Problems:   * No active hospital problems. *   Total Time spent with patient: 15 minutes  Subjective:   Ruth Griffith is a 24 y.o. female patient admitted with a history bipolar disorder but no additional complaints  HPI:  Per ED MD HPI [name] is a 24 y.o. female with PMH as noted below who presents under involuntary commitment due to erratic behavior and concern for mania.  Per the IVC paperwork, the patient has been agitated, not sleeping, pacing, and has been having mood swings.  She took a dose of lithium and Zyprexa today from her prior hospitalization, but otherwise has not been on any medication in the last 2 years.  The patient denies SI or HI.  She denies any acute medical complaints.    Past Psychiatric History:  Risk to Self: Suicidal Ideation: No Suicidal Intent: No Is patient at risk for suicide?: No Suicidal Plan?: No Access to Means: No What has been your use of drugs/alcohol within the last 12 months?: Denied How many times?: 0 Other Self Harm Risks: Denied Triggers for Past Attempts: None known Intentional Self Injurious Behavior: None Risk to Others: Homicidal Ideation: No Thoughts of Harm to Others: No Current Homicidal Intent: No Current Homicidal Plan: No Access to Homicidal Means: No Identified Victim: None identified History of harm to others?: No Assessment of Violence: None Noted Violent Behavior Description: denied Does patient have access to weapons?: No Criminal Charges Pending?: No Does patient have a court date: No Prior Inpatient Therapy: Prior Inpatient Therapy: Yes Prior Therapy Dates: 2018 Prior Therapy Facilty/Provider(s): [facility] Reason for Treatment: Bipolar disorder Prior  Outpatient Therapy: Prior Outpatient Therapy: Yes Prior Therapy Dates: 2018 Prior Therapy Facilty/Provider(s): [facility] Reason for Treatment: Trauma,  Does patient have an ACCT team?: No Does patient have Intensive In-House Services?  : No Does patient have [facility] services? : No Does patient have P4CC services?: No  Past Medical History:  Past Medical History:  Diagnosis Date  . Bipolar 1 disorder (HCC)   . Cannabis use disorder, moderate, dependence (HCC) 11/30/2016  . Dermatitis, seborrheic    also known as Tinea or Pityriasis versicolor (fungal skin infection )  . Tear of meniscus of left knee     Past Surgical History:  Procedure Laterality Date  . KNEE ARTHROSCOPY WITH MEDIAL MENISECTOMY Left 01/30/2014   Procedure: LEFT KNEE ARTHROSCOPY WITH PLICA RESECTION;  Surgeon: Eugenia Mcalpine, MD;  Location: [facility];  Service: Orthopedics;  Laterality: Left;   Family History: No family history on file. Family Psychiatric  History:  Social History:  Social History   Substance and Sexual Activity  Alcohol Use Not Currently     Social History   Substance and Sexual Activity  Drug Use Not Currently  . Types: Marijuana   Comment: Unknown related to AMS    Social History   Socioeconomic History  . Marital status: Single    Spouse name: Not on file  . Number of children: 1  . Years of education: Not on file  . Highest education level: Not on file  Occupational History  . Not on file  Tobacco Use  . Smoking status: Passive Smoke Exposure - Never Smoker  . Smokeless tobacco: Never Used  Substance and Sexual Activity  . Alcohol  use: Not Currently  . Drug use: Not Currently    Types: Marijuana    Comment: Unknown related to AMS  . Sexual activity: Not Currently    Birth control/protection: None    Comment: Unknown  Other Topics Concern  . Not on file  Social History Narrative   ** Merged History Encounter **       Father smokes.  Lives  with both parents.  No family anesthesia problems     Social Determinants of Health   Financial Resource Strain:   . Difficulty of Paying Living Expenses:   Food Insecurity:   . Worried About Programme researcher, broadcasting/film/videounning Out of Food in the Last Year:   . Baristaan Out of Food in the Last Year:   Transportation Needs:   . Freight forwarderLack of Transportation (Medical):   Marland Kitchen. Lack of Transportation (Non-Medical):   Physical Activity:   . Days of Exercise per Week:   . Minutes of Exercise per Session:   Stress:   . Feeling of Stress :   Social Connections:   . Frequency of Communication with Friends and Family:   . Frequency of Social Gatherings with Friends and Family:   . Attends Religious Services:   . Active Member of Clubs or Organizations:   . Attends BankerClub or Organization Meetings:   Marland Kitchen. Marital Status:    Additional Social History:    Allergies:  No Known Allergies  Labs:  Results for orders placed or performed during the hospital encounter of 05/16/19 (from the past 48 hour(s))  Comprehensive metabolic panel     Status: Abnormal   Collection Time: 05/16/19  2:25 PM  Result Value Ref Range   Sodium 143 135 - 145 mmol/L   Potassium 3.2 (L) 3.5 - 5.1 mmol/L   Chloride 110 98 - 111 mmol/L   CO2 24 22 - 32 mmol/L   Glucose, Bld 100 (H) 70 - 99 mg/dL    Comment: Glucose reference range applies only to samples taken after fasting for at least 8 hours.   BUN 6 6 - 20 mg/dL   Creatinine, Ser 1.610.68 0.44 - 1.00 mg/dL   Calcium 9.5 8.9 - 09.610.3 mg/dL   Total Protein 8.4 (H) 6.5 - 8.1 g/dL   Albumin 4.8 3.5 - 5.0 g/dL   AST 19 15 - 41 U/L   ALT 27 0 - 44 U/L   Alkaline Phosphatase 88 38 - 126 U/L   Total Bilirubin 0.8 0.3 - 1.2 mg/dL   GFR calc non Af Amer >60 >60 mL/min   GFR calc Af Amer >60 >60 mL/min   Anion gap 9 5 - 15    Comment: Performed at [facility], [address] [place], [place] [address]  Ethanol     Status: None   Collection Time: 05/16/19  2:25 PM  Result Value Ref Range   Alcohol,  Ethyl (B) <10 <10 mg/dL    Comment: (NOTE) Lowest detectable limit for serum alcohol is 10 mg/dL. For medical purposes only. Performed at [facility], [address] [place], [place] [address]   Salicylate level     Status: Abnormal   Collection Time: 05/16/19  2:25 PM  Result Value Ref Range   Salicylate Lvl <7.0 (L) 7.0 - 30.0 mg/dL    Comment: Performed at [facility], [address] [place], [place] [address]  Acetaminophen level     Status: Abnormal   Collection Time: 05/16/19  2:25 PM  Result Value Ref Range  Acetaminophen (Tylenol), Serum <10 (L) 10 - 30 ug/mL    Comment: (NOTE) Therapeutic concentrations vary significantly. A range of 10-30 ug/mL  may be an effective concentration for many patients. However, some  are best treated at concentrations outside of this range. Acetaminophen concentrations >150 ug/mL at 4 hours after ingestion  and >50 ug/mL at 12 hours after ingestion are often associated with  toxic reactions. Performed at [facility], [address] [place], [place] [address]   cbc     Status: None   Collection Time: 05/16/19  2:25 PM  Result Value Ref Range   WBC 9.8 4.0 - 10.5 K/uL   RBC 4.75 3.87 - 5.11 MIL/uL   Hemoglobin 12.5 12.0 - 15.0 g/dL   HCT 40.4 36.0 - 46.0 %   MCV 85.1 80.0 - 100.0 fL   MCH 26.3 26.0 - 34.0 pg   MCHC 30.9 30.0 - 36.0 g/dL   RDW 14.2 11.5 - 15.5 %   Platelets 300 150 - 400 K/uL   nRBC 0.0 0.0 - 0.2 %    Comment: Performed at [facility], [address] [place], [place] [address]  Lithium level     Status: Abnormal   Collection Time: 05/16/19  2:25 PM  Result Value Ref Range   Lithium Lvl 1.48 (H) 0.60 - 1.20 mmol/L    Comment: Performed at [facility], [address], [place], [place] [address]  Urine Drug Screen, Qualitative     Status: None   Collection Time: 05/16/19  3:04 PM  Result Value Ref Range   Tricyclic, Ur Screen NONE DETECTED NONE DETECTED    Amphetamines, Ur Screen NONE DETECTED NONE DETECTED   MDMA (Ecstasy)Ur Screen NONE DETECTED NONE DETECTED   Cocaine Metabolite,Ur [place] NONE DETECTED NONE DETECTED   Opiate, Ur Screen NONE DETECTED NONE DETECTED   Phencyclidine (PCP) Ur S NONE DETECTED NONE DETECTED   Cannabinoid 50 Ng, Ur [place] NONE DETECTED NONE DETECTED   Barbiturates, Ur Screen NONE DETECTED NONE DETECTED   Benzodiazepine, Ur Scrn NONE DETECTED NONE DETECTED   Methadone Scn, Ur NONE DETECTED NONE DETECTED    Comment: (NOTE) Tricyclics + metabolites, urine    Cutoff 1000 ng/mL Amphetamines + metabolites, urine  Cutoff 1000 ng/mL MDMA (Ecstasy), urine              Cutoff 500 ng/mL Cocaine Metabolite, urine          Cutoff 300 ng/mL Opiate + metabolites, urine        Cutoff 300 ng/mL Phencyclidine (PCP), urine         Cutoff 25 ng/mL Cannabinoid, urine                 Cutoff 50 ng/mL Barbiturates + metabolites, urine  Cutoff 200 ng/mL Benzodiazepine, urine              Cutoff 200 ng/mL Methadone, urine                   Cutoff 300 ng/mL The urine drug screen provides only a preliminary, unconfirmed analytical test result and should not be used for non-medical purposes. Clinical consideration and professional judgment should be applied to any positive drug screen result due to possible interfering substances. A more specific alternate chemical method must be used in order to obtain a confirmed analytical result. Gas chromatography / mass spectrometry (GC/MS) is the preferred confirmat ory method. Performed at [facility], [address] [place],  [address]  Pregnancy, urine POC     Status: None   Collection Time: 05/16/19  3:06 PM  Result Value Ref Range   Preg Test, Ur NEGATIVE NEGATIVE    Comment:        THE SENSITIVITY OF THIS METHODOLOGY IS >24 mIU/mL   Respiratory Panel by RT PCR (Flu A&B, Covid) - Nasopharyngeal Swab     Status: None   Collection Time: 05/16/19  9:02 PM   Specimen:  Nasopharyngeal Swab  Result Value Ref Range   SARS Coronavirus 2 by RT PCR NEGATIVE NEGATIVE    Comment: (NOTE) SARS-CoV-2 target nucleic acids are NOT DETECTED. The SARS-CoV-2 RNA is generally detectable in upper respiratoy specimens during the acute phase of infection. The lowest concentration of SARS-CoV-2 viral copies this assay can detect is 131 copies/mL. A negative result does not preclude SARS-Cov-2 infection and should not be used as the sole basis for treatment or other patient management decisions. A negative result may occur with  improper specimen collection/handling, submission of specimen other than nasopharyngeal swab, presence of viral mutation(s) within the areas targeted by this assay, and inadequate number of viral copies (<131 copies/mL). A negative result must be combined with clinical observations, patient history, and epidemiological information. The expected result is Negative. Fact Sheet for Patients:  https://www.moore.com/ Fact Sheet for Healthcare Providers:  https://www.young.biz/ This test is not yet ap proved or cleared by the [place] FDA and  has been authorized for detection and/or diagnosis of SARS-CoV-2 by FDA under an Emergency Use Authorization (EUA). This EUA will remain  in effect (meaning this test can be used) for the duration of the COVID-19 declaration under Section 564(b)(1) of the Act, 21 U.S.C. section 360bbb-3(b)(1), unless the authorization is terminated or revoked sooner.    Influenza A by PCR NEGATIVE NEGATIVE   Influenza B by PCR NEGATIVE NEGATIVE    Comment: (NOTE) The Xpert Xpress SARS-CoV-2/FLU/RSV assay is intended as an aid in  the diagnosis of influenza from Nasopharyngeal swab specimens and  should not be used as a sole basis for treatment. Nasal washings and  aspirates are unacceptable for Xpert Xpress SARS-CoV-2/FLU/RSV  testing. Fact Sheet for  Patients: https://www.moore.com/ Fact Sheet for Healthcare Providers: https://www.young.biz/ This test is not yet approved or cleared by the [place] FDA and  has been authorized for detection and/or diagnosis of SARS-CoV-2 by  FDA under an Emergency Use Authorization (EUA). This EUA will remain  in effect (meaning this test can be used) for the duration of the  Covid-19 declaration under Section 564(b)(1) of the Act, 21  U.S.C. section 360bbb-3(b)(1), unless the authorization is  terminated or revoked. Performed at [facility], [address], [place], [place] [address]     Current Facility-Administered Medications  Medication Dose Route Frequency Provider Last Rate Last Admin  . diphenhydrAMINE (BENADRYL) capsule 25 mg  25 mg Oral Once Cindy Hazy, MD       Current Outpatient Medications  Medication Sig Dispense Refill  . ferrous sulfate 325 (65 FE) MG tablet Take 1 tablet (325 mg total) by mouth 2 (two) times daily with a meal. 60 tablet 1  . Prenatal Vit-Fe Fumarate-FA (MULTIVITAMIN-PRENATAL) 27-0.8 MG TABS tablet Take 1 tablet by mouth daily at 12 noon. 30 tablet 11  . docusate sodium (COLACE) 100 MG capsule Take 1 capsule (100 mg total) by mouth daily as needed for mild constipation. (Patient not taking: Reported on 04/17/2019) 30 capsule 2     Review of Systems  Blood  pressure 136/78, pulse 80, temperature 98.2 F (36.8 C), resp. rate 18, height 5\' 9"  (1.753 m), weight 88.5 kg, SpO2 99 %, unknown if currently breastfeeding.Body mass index is 28.8 kg/m.  General Appearance: Casual  Eye Contact:  Good  Speech:  Normal Rate  Volume:  Normal  Mood:  Euthymic  Affect:  Appropriate  Thought Process:  Goal Directed  Orientation:  Full (Time, Place, and Person)  Thought Content:  Logical  Suicidal Thoughts:  No  Homicidal Thoughts:  No  Memory:  Immediate;   Good  Judgement:  Fair  Insight:  Good  Psychomotor  Activity:  Normal  Concentration:  Concentration: Good  Recall:  Good  Fund of Knowledge:  Good  Language:  Good  Akathisia:  No  Handed:  Ambidextrous  AIMS (if indicated):     Assets:  Physical Health  ADL's:  Intact  Cognition:  WNL  Sleep:        Treatment Plan Summary: Plan transfer to [facility] as planned. No reason to admit to inpatient unit here.  Disposition: Plan transfer to [facility] as planned. No reason to admit to inpatient unit here.  [facility], MD 05/17/2019 3:37 PM

## 2019-05-17 NOTE — ED Provider Notes (Signed)
Emergency Medicine Observation Re-evaluation Note  Ruth Griffith is a 24 y.o. female, seen on rounds today.  Pt initially presented to the ED for complaints of Mental Health Problem Currently, the patient is is no acute distress  Physical Exam  BP (!) 151/87 (BP Location: Left Arm)   Pulse 81   Temp 98.8 F (37.1 C) (Oral)   Resp 18   Ht 5\' 9"  (1.753 m)   Wt 88.5 kg   SpO2 99%   BMI 28.80 kg/m  Physical Exam  ED Course / MDM  EKG:    I have reviewed the labs performed to date as well as medications administered while in observation.   Plan  Current plan is for pending psych Patient is under full IVC at this time.   , MD 05/17/19 [phone]

## 2019-06-01 ENCOUNTER — Other Ambulatory Visit: Payer: Self-pay

## 2019-06-01 ENCOUNTER — Emergency Department: Payer: Medicaid Other

## 2019-06-01 ENCOUNTER — Emergency Department
Admission: EM | Admit: 2019-06-01 | Discharge: 2019-06-01 | Disposition: A | Discharge status: Home / Self Care | Payer: Medicaid Other | Attending: Emergency Medicine | Admitting: Emergency Medicine

## 2019-06-01 ENCOUNTER — Encounter: Payer: Self-pay | Admitting: *Deleted

## 2019-06-01 DIAGNOSIS — Z79899 Other long term (current) drug therapy: Secondary | ICD-10-CM | POA: Insufficient documentation

## 2019-06-01 DIAGNOSIS — R000 Tachycardia, unspecified: Secondary | ICD-10-CM

## 2019-06-01 DIAGNOSIS — Z7722 Contact with and (suspected) exposure to environmental tobacco smoke (acute) (chronic): Secondary | ICD-10-CM | POA: Diagnosis not present

## 2019-06-01 DIAGNOSIS — R002 Palpitations: Secondary | ICD-10-CM | POA: Insufficient documentation

## 2019-06-01 LAB — SALICYLATE LEVEL: Salicylate Lvl: <7 mg/dL — ABNORMAL LOW (ref 7.0–30.0)

## 2019-06-01 LAB — COMPREHENSIVE METABOLIC PANEL
ALT: 21 U/L (ref 0–44)
AST: 21 U/L (ref 15–41)
Albumin: 4.6 g/dL (ref 3.5–5.0)
Alkaline Phosphatase: 75 U/L (ref 38–126)
Anion gap: 10 (ref 5–15)
BUN: 12 mg/dL (ref 6–20)
CO2: 23 mmol/L (ref 22–32)
Calcium: 9.9 mg/dL (ref 8.9–10.3)
Chloride: 107 mmol/L (ref 98–111)
Creatinine, Ser: 0.77 mg/dL (ref 0.44–1.00)
GFR calc Af Amer: >60 mL/min (ref >60)
GFR calc non Af Amer: >60 mL/min (ref >60)
Glucose, Bld: 113 mg/dL — ABNORMAL HIGH (ref 70–99)
Potassium: 3.5 mmol/L (ref 3.5–5.1)
Sodium: 140 mmol/L (ref 135–145)
Total Bilirubin: 0.8 mg/dL (ref 0.3–1.2)
Total Protein: 8.4 g/dL — ABNORMAL HIGH (ref 6.5–8.1)

## 2019-06-01 LAB — CBC WITH DIFFERENTIAL/PLATELET
Abs Immature Granulocytes: 0.06 10*3/uL (ref 0.00–0.07)
Basophils Absolute: 0 10*3/uL (ref 0.0–0.1)
Basophils Relative: 0 %
Eosinophils Absolute: 0 10*3/uL (ref 0.0–0.5)
Eosinophils Relative: 0 %
HCT: 38.9 % (ref 36.0–46.0)
Hemoglobin: 12.3 g/dL (ref 12.0–15.0)
Immature Granulocytes: 1 %
Lymphocytes Relative: 23 %
Lymphs Abs: 2.7 10*3/uL (ref 0.7–4.0)
MCH: 26.1 pg (ref 26.0–34.0)
MCHC: 31.6 g/dL (ref 30.0–36.0)
MCV: 82.6 fL (ref 80.0–100.0)
Monocytes Absolute: 0.6 10*3/uL (ref 0.1–1.0)
Monocytes Relative: 5 %
Neutro Abs: 8.2 10*3/uL — ABNORMAL HIGH (ref 1.7–7.7)
Neutrophils Relative %: 71 %
Platelets: 176 10*3/uL (ref 150–400)
RBC: 4.71 MIL/uL (ref 3.87–5.11)
RDW: 13.6 % (ref 11.5–15.5)
WBC: 11.6 10*3/uL — ABNORMAL HIGH (ref 4.0–10.5)
nRBC: 0 % (ref 0.0–0.2)

## 2019-06-01 LAB — TSH: TSH: 3.648 u[IU]/mL (ref 0.350–4.500)

## 2019-06-01 LAB — URINE DRUG SCREEN, QUALITATIVE (ARMC ONLY)
Amphetamines, Ur Screen: NOT DETECTED
Barbiturates, Ur Screen: NOT DETECTED
Benzodiazepine, Ur Scrn: NOT DETECTED
Cannabinoid 50 Ng, Ur ~~LOC~~: NOT DETECTED
Cocaine Metabolite,Ur ~~LOC~~: NOT DETECTED
MDMA (Ecstasy)Ur Screen: NOT DETECTED
Methadone Scn, Ur: NOT DETECTED
Opiate, Ur Screen: NOT DETECTED
Phencyclidine (PCP) Ur S: NOT DETECTED
Tricyclic, Ur Screen: NOT DETECTED

## 2019-06-01 LAB — URINALYSIS, COMPLETE (UACMP) WITH MICROSCOPIC
Bacteria, UA: NONE SEEN
Bilirubin Urine: NEGATIVE
Glucose, UA: NEGATIVE mg/dL
Hgb urine dipstick: NEGATIVE
Ketones, ur: NEGATIVE mg/dL
Leukocytes,Ua: NEGATIVE
Nitrite: NEGATIVE
Protein, ur: NEGATIVE mg/dL
Specific Gravity, Urine: 1.006 (ref 1.005–1.030)
pH: 7 (ref 5.0–8.0)

## 2019-06-01 LAB — POCT PREGNANCY, URINE: Preg Test, Ur: NEGATIVE

## 2019-06-01 LAB — T4, FREE: Free T4: 0.91 ng/dL (ref 0.61–1.12)

## 2019-06-01 LAB — ETHANOL: Alcohol, Ethyl (B): <10 mg/dL (ref <10)

## 2019-06-01 LAB — ACETAMINOPHEN LEVEL: Acetaminophen (Tylenol), Serum: <10 ug/mL — ABNORMAL LOW (ref 10–30)

## 2019-06-01 LAB — VALPROIC ACID LEVEL: Valproic Acid Lvl: 89 ug/mL (ref 50.0–100.0)

## 2019-06-01 LAB — TROPONIN I (HIGH SENSITIVITY): Troponin I (High Sensitivity): <2 ng/L (ref <18)

## 2019-06-01 MED ORDER — LORAZEPAM 2 MG/ML IJ SOLN
1.0000 mg | Freq: Once | INTRAMUSCULAR | Status: AC
  Administered 2019-06-01: 1 mg via INTRAVENOUS
  Filled 2019-06-01: qty 1

## 2019-06-01 MED ORDER — SODIUM CHLORIDE 0.9 % IV BOLUS
1000.0000 mL | Freq: Once | INTRAVENOUS | Status: AC
  Administered 2019-06-01: 01:00:00 1000 mL via INTRAVENOUS

## 2019-06-01 NOTE — Discharge Instructions (Addendum)
Avoid caffeine whenever possible.  Continue to take your medicines as directed by your doctor.  Return to the ER for worsening symptoms, persistent vomiting, difficulty breathing or other concerns.

## 2019-06-01 NOTE — ED Provider Notes (Signed)
[facility] Emergency Department Provider Note   ____________________________________________   None    (approximate)  I have reviewed the triage vital signs and the nursing notes.   HISTORY  Chief Complaint Tachycardia    HPI Ruth Griffith is a 24 y.o. female who presents to the ED from home with a chief complaint of palpitations.  Patient was released from psychiatric hospitalization about 1 week ago.  She has been taking 50 mg Seroquel at night for the past week.  Tonight she began to experience palpitations approximately 1 hour after taking her nightly Seroquel.  States she was a little upset earlier today after her therapy session at [facility].  She also did drink a Coca-Cola and energy drink which she normally does not do.  Denies fever, cough, chest pain, shortness of breath, abdominal pain, nausea or vomiting.  Endorses anxiety without SI/HI.  Denies sick contacts       Past Medical History:  Diagnosis Date  . Bipolar 1 disorder (HCC)   . Cannabis use disorder, moderate, dependence (HCC) 11/30/2016  . Dermatitis, seborrheic    also known as Tinea or Pityriasis versicolor (fungal skin infection )  . Tear of meniscus of left knee     Patient Active Problem List   Diagnosis Date Noted  . Bipolar affective disorder, current episode mixed (HCC)   . Normal vaginal delivery 03/08/2019  . Postpartum care following vaginal delivery 03/08/2019  . Encounter for elective induction of labor 03/04/2019  . Supervision of other normal pregnancy, antepartum 08/10/2018  . Bipolar I disorder, most recent episode mixed, severe with psychotic features (HCC) 11/30/2016    Past Surgical History:  Procedure Laterality Date  . KNEE ARTHROSCOPY WITH MEDIAL MENISECTOMY Left 01/30/2014   Procedure: LEFT KNEE ARTHROSCOPY WITH PLICA RESECTION;  Surgeon: Eugenia Mcalpine, MD;  Location: [facility];  Service: Orthopedics;  Laterality: Left;    Prior  to Admission medications   Medication Sig Start Date End Date Taking? Authorizing Provider  docusate sodium (COLACE) 100 MG capsule Take 1 capsule (100 mg total) by mouth daily as needed for mild constipation. Patient not taking: Reported on 04/17/2019 03/08/19 03/07/20  Farrel Conners, CNM  ferrous sulfate 325 (65 FE) MG tablet Take 1 tablet (325 mg total) by mouth 2 (two) times daily with a meal. 03/08/19   Farrel Conners, CNM  Prenatal Vit-Fe Fumarate-FA (MULTIVITAMIN-PRENATAL) 27-0.8 MG TABS tablet Take 1 tablet by mouth daily at 12 noon. 10/27/18   Nadara Mustard, MD    Allergies Patient has no known allergies.  No family history on file.  Social History Social History   Tobacco Use  . Smoking status: Passive Smoke Exposure - Never Smoker  . Smokeless tobacco: Never Used  Substance Use Topics  . Alcohol use: Not Currently  . Drug use: Not Currently    Types: Marijuana    Comment: Unknown related to AMS    Review of Systems  Constitutional: No fever/chills Eyes: No visual changes. ENT: No sore throat. Cardiovascular: Positive for palpitations.  Denies chest pain. Respiratory: Denies shortness of breath. Gastrointestinal: No abdominal pain.  No nausea, no vomiting.  No diarrhea.  No constipation. Genitourinary: Negative for dysuria. Musculoskeletal: Negative for back pain. Skin: Negative for rash. Neurological: Negative for headaches, focal weakness or numbness. Psychiatric: Positive for anxiety.  ____________________________________________   PHYSICAL EXAM:  VITAL SIGNS: ED Triage Vitals [06/01/19 0054]  Enc Vitals Group     BP  Pulse Rate (!) 167     Resp 16     Temp 99 F (37.2 C)     Temp Source Oral     SpO2 100 %     Weight      Height      Head Circumference      Peak Flow      Pain Score 0     Pain Loc      Pain Edu?      Excl. in [place]?     Constitutional: Alert and oriented. Well appearing and in no acute distress. Eyes: Conjunctivae  are normal. PERRL. EOMI. Head: Atraumatic. Nose: No congestion/rhinnorhea. Mouth/Throat: Mucous membranes are moist.  Oropharynx non-erythematous. Neck: No stridor.   Cardiovascular: Tachycardic rate, regular rhythm. Grossly normal heart sounds.  Good peripheral circulation. Respiratory: Normal respiratory effort.  No retractions. Lungs CTAB. Gastrointestinal: Soft and nontender. No distention. No abdominal bruits. No CVA tenderness. Musculoskeletal: No lower extremity tenderness nor edema.  No joint effusions. Neurologic:  Normal speech and language. No gross focal neurologic deficits are appreciated. No gait instability. Skin:  Skin is warm, dry and intact. No rash noted. Psychiatric: Mood and affect are mildly anxious. Speech and behavior are normal.  ____________________________________________   LABS (all labs ordered are listed, but only abnormal results are displayed)  Labs Reviewed  CBC WITH DIFFERENTIAL/PLATELET - Abnormal; Notable for the following components:      Result Value   WBC 11.6 (*)    Neutro Abs 8.2 (*)    All other components within normal limits  COMPREHENSIVE METABOLIC PANEL - Abnormal; Notable for the following components:   Glucose, Bld 113 (*)    Total Protein 8.4 (*)    All other components within normal limits  ACETAMINOPHEN LEVEL - Abnormal; Notable for the following components:   Acetaminophen (Tylenol), Serum <10 (*)    All other components within normal limits  SALICYLATE LEVEL - Abnormal; Notable for the following components:   Salicylate Lvl <8.2 (*)    All other components within normal limits  URINALYSIS, COMPLETE (UACMP) WITH MICROSCOPIC - Abnormal; Notable for the following components:   Color, Urine STRAW (*)    APPearance CLEAR (*)    All other components within normal limits  ETHANOL  URINE DRUG SCREEN, QUALITATIVE ([facility] ONLY)  TSH  T4, FREE  VALPROIC ACID LEVEL  POC URINE PREG, ED  POCT PREGNANCY, URINE  TROPONIN I (HIGH  SENSITIVITY)  TROPONIN I (HIGH SENSITIVITY)   ____________________________________________  EKG  ED ECG REPORT I, Meredith Mells J, the attending physician, personally viewed and interpreted this ECG.   Date: 06/01/2019  EKG Time: 0053  Rate: 152  Rhythm: sinus tachycardia  Axis: Normal  Intervals:none  ST&T Change: Nonspecific  ____________________________________________  RADIOLOGY  ED MD interpretation: No acute cardiopulmonary process  Official radiology report(s): DG Chest Port 1 View  Result Date: 06/01/2019 CLINICAL DATA:  Initial evaluation for palpitations. EXAM: PORTABLE CHEST 1 VIEW COMPARISON:  None available. FINDINGS: Transverse heart size within normal limits. Mediastinal silhouette normal. Lungs mildly hypoinflated. No focal infiltrates, pulmonary edema, or pleural effusion. No pneumothorax. No acute osseous finding.  Thoracic levoscoliosis noted. IMPRESSION: No active cardiopulmonary disease. Electronically Signed   By: Jeannine Boga M.D.   On: 06/01/2019 01:25    ____________________________________________   PROCEDURES  Procedure(s) performed (including Critical Care):  .1-3 Lead EKG Interpretation Performed by: Paulette Blanch, MD Authorized by: Paulette Blanch, MD     Interpretation: abnormal  ECG rate:  130   ECG rate assessment: tachycardic     Rhythm: sinus tachycardia     Ectopy: none     Conduction: normal   Comments:     Patient was placed on cardiac monitor to monitor for arrhythmias     ____________________________________________   INITIAL IMPRESSION / ASSESSMENT AND PLAN / ED COURSE  As part of my medical decision making, I reviewed the following data within the electronic MEDICAL RECORD NUMBER History obtained from family, Nursing notes reviewed and incorporated, Labs reviewed, EKG interpreted, Old chart reviewed, Radiograph reviewed and Notes from prior ED visits     Ruth Griffith was evaluated in Emergency Department on  06/01/2019 for the symptoms described in the history of present illness. She was evaluated in the context of the global COVID-19 pandemic, which necessitated consideration that the patient might be at risk for infection with the SARS-CoV-2 virus that causes COVID-19. Institutional protocols and algorithms that pertain to the evaluation of patients at risk for COVID-19 are in a state of rapid change based on information released by regulatory bodies including the CDC and federal and state organizations. These policies and algorithms were followed during the patient's care in the ED.    24 year old female presenting with palpitations; started on Seroquel and Depakote approximately 1 week ago.  Differential diagnosis includes but is not limited to ACS, thyroid dysfunction, electrolyte abnormalities, caffeine use, anxiety, PE, etc.  Will check toxicological lab work and urine, chest x-ray.  Initiate IV fluid resuscitation, 1 mg IV Ativan and reassess.   Clinical Course as of May 31 241  Thu Jun 01, 2019  0212 Heart rate normalized.  Patient resting in no acute distress.  Awaiting UA results.   [JS]  [id number] Updated patient and her father on all test results.  She is feeling significantly better.  Advised decreasing caffeine intake and continuing on her psychiatric medications.  Strict return precautions given.  Both verbalized understanding and agree with plan of care.   [JS]    Clinical Course User Index [JS] Irean Hong, MD     ____________________________________________   FINAL CLINICAL IMPRESSION(S) / ED DIAGNOSES  Final diagnoses:  Palpitations  Tachycardia     ED Discharge Orders    None       Note:  This document was prepared using Dragon voice recognition software and may include unintentional dictation errors.   Irean Hong, MD 06/01/19 [id number]

## 2019-06-01 NOTE — ED Triage Notes (Signed)
Pt says that she took seroquel tonight around 2100 and she started feeling very anxious. HR 150-160 in triage.    Pt father says that around 45 minutes the patient said she was "feeling stressed" and he felt her pulse and it was in the 180's.

## 2019-09-28 ENCOUNTER — Encounter: Payer: Self-pay | Admitting: Obstetrics

## 2019-09-28 ENCOUNTER — Other Ambulatory Visit: Payer: Self-pay

## 2019-09-28 ENCOUNTER — Ambulatory Visit (INDEPENDENT_AMBULATORY_CARE_PROVIDER_SITE_OTHER): Payer: Medicaid Other | Admitting: Obstetrics

## 2019-09-28 VITALS — BP 130/80 | Ht 69.0 in | Wt 179.0 lb

## 2019-09-28 DIAGNOSIS — Z3009 Encounter for other general counseling and advice on contraception: Secondary | ICD-10-CM

## 2019-09-28 DIAGNOSIS — Z30016 Encounter for initial prescription of transdermal patch hormonal contraceptive device: Secondary | ICD-10-CM | POA: Diagnosis not present

## 2019-09-28 DIAGNOSIS — N898 Other specified noninflammatory disorders of vagina: Secondary | ICD-10-CM | POA: Insufficient documentation

## 2019-09-28 MED ORDER — NORELGESTROMIN-ETH ESTRADIOL 150-35 MCG/24HR TD PTWK: 1.0000 | MEDICATED_PATCH | TRANSDERMAL | 12 refills | Status: DC

## 2019-09-28 NOTE — Progress Notes (Signed)
Ruth Griffith is a 24 y.o. G1P1001 who LMP was Patient's last menstrual period was 09/20/2019 (approximate)., presents today for a problem visit.   Patient complains of an abnormal vaginal discharge for 5 days. Discharge described as: white and odorless. Vaginal symptoms include local irritation and vulvar itching.Vulvar symptoms include vulvar itching.STI Risk: Very low risk of STD exposure.   Other associated symptoms: none.Menstrual pattern: She had been bleeding regularly. Contraception: none and and she admits to a need for contraceptive management..  She denies  recent antibiotic exposure, denies changes in soaps, detergents coinciding with the onset of her symptoms.  She has not previously self treated or been under treatment by another provider for these symptoms.   In addition , she admits to having started with an IUD earlier in the year, and then had it removed a month later. She is not prewently sexually active, but also does not want a pregnancy any time soon, having had a baby . Her attention has been focused on mood stabilization; and she is now on daily Depakote. Her psychiatrist has strongly advised her to start on [facility] as the Depakote is contraindicated in pregnancy.     Obstetrics & Gynecology Office Visit   Chief Complaint:  Chief Complaint  Patient presents with  . Vaginal Discharge    fishy and sour odor, no itchiness x 1 month  . Contraception    interested in [facility], currently no BC    History of Present Illness: See the note above   Review of Systems:  Review of Systems  Constitutional: Negative.   HENT: Negative.   Eyes: Negative.   Respiratory: Negative.   Cardiovascular: Negative.   Gastrointestinal: Negative.   Genitourinary: Negative.   Musculoskeletal: Negative.   Skin: Negative.   Neurological: Negative.   Endo/Heme/Allergies: Negative.   Psychiatric/Behavioral: Negative.      Past Medical History:  Past Medical History:  Diagnosis Date  .  Bipolar 1 disorder (HCC)   . Cannabis use disorder, moderate, dependence (HCC) 11/30/2016  . Dermatitis, seborrheic    also known as Tinea or Pityriasis versicolor (fungal skin infection )  . Encounter for elective induction of labor 03/04/2019  . Normal vaginal delivery 03/08/2019  . Postpartum care following vaginal delivery 03/08/2019  . Supervision of other normal pregnancy, antepartum 08/10/2018   [facility] Prenatal Labs Dating By LMP c/w 10w u/s Blood type: O/Positive/-- (07/06 1423)  Genetic Screen  NIPS: Normal XX Antibody:Negative (07/06 1423) Anatomic [place] normal Rubella: 6.01 (07/06 1423) Varicella: Immune GTT  Third trimester: 131 RPR: Non Reactive (11/06 1122)  Rhogam  not needed HBsAg: Negative (07/06 1423)  TDaP vaccine   12/11                     Flu Shot: Declined HIV: Non R  . Tear of meniscus of left knee     Past Surgical History:  Past Surgical History:  Procedure Laterality Date  . KNEE ARTHROSCOPY WITH MEDIAL MENISECTOMY Left 01/30/2014   Procedure: LEFT KNEE ARTHROSCOPY WITH PLICA RESECTION;  Surgeon: Eugenia Mcalpine, MD;  Location: [facility];  Service: Orthopedics;  Laterality: Left;    Gynecologic History: Patient's last menstrual period was 09/20/2019 (approximate).  Obstetric History: G1P1001  Family History:  History reviewed. No pertinent family history.  Social History:  Social History   Socioeconomic History  . Marital status: Single    Spouse name: Not on file  . Number of children: 1  . Years  of education: Not on file  . Highest education level: Not on file  Occupational History  . Not on file  Tobacco Use  . Smoking status: Passive Smoke Exposure - Never Smoker  . Smokeless tobacco: Never Used  Vaping Use  . Vaping Use: Never used  Substance and Sexual Activity  . Alcohol use: Not Currently  . Drug use: Not Currently    Types: Marijuana    Comment: Unknown related to AMS  . Sexual activity: Not Currently    Birth  control/protection: None  Other Topics Concern  . Not on file  Social History Narrative   ** Merged History Encounter **       Father smokes.  Lives with both parents.  No family anesthesia problems     Social Determinants of Health   Financial Resource Strain:   . Difficulty of Paying Living Expenses: Not on file  Food Insecurity:   . Worried About Programme researcher, broadcasting/film/video in the Last Year: Not on file  . Ran Out of Food in the Last Year: Not on file  Transportation Needs:   . Lack of Transportation (Medical): Not on file  . Lack of Transportation (Non-Medical): Not on file  Physical Activity:   . Days of Exercise per Week: Not on file  . Minutes of Exercise per Session: Not on file  Stress:   . Feeling of Stress : Not on file  Social Connections:   . Frequency of Communication with Friends and Family: Not on file  . Frequency of Social Gatherings with Friends and Family: Not on file  . Attends Religious Services: Not on file  . Active Member of Clubs or Organizations: Not on file  . Attends Banker Meetings: Not on file  . Marital Status: Not on file  Intimate Partner Violence:   . Fear of Current or Ex-Partner: Not on file  . Emotionally Abused: Not on file  . Physically Abused: Not on file  . Sexually Abused: Not on file    Allergies:  No Known Allergies  Medications: Prior to Admission medications   Medication Sig Start Date End Date Taking? Authorizing Provider  divalproex (DEPAKOTE) 250 MG DR tablet Take 250 mg by mouth every morning. 08/28/19  Yes [provider]  divalproex (DEPAKOTE) 500 MG DR tablet Take 500 mg by mouth at bedtime. 09/26/19  Yes [provider]  ferrous sulfate 325 (65 FE) MG tablet Take 1 tablet (325 mg total) by mouth 2 (two) times daily with a meal. 03/08/19  Yes Farrel Conners, CNM  QUEtiapine (SEROQUEL) 50 MG tablet Take 50 mg by mouth at bedtime. 09/26/19  Yes [provider]  hydrOXYzine  (ATARAX/VISTARIL) 25 MG tablet Take 25 mg by mouth 2 (two) times daily as needed. Patient not taking: Reported on 09/28/2019 08/08/19   [provider]  norelgestromin-ethinyl estradiol (ORTHO EVRA) 150-35 MCG/24HR transdermal patch Place 1 patch onto the skin once a week. 09/28/19   Mirna Mires, CNM    Physical Exam Vitals:  Vitals:   09/28/19 1329  BP: 130/80   Patient's last menstrual period was 09/20/2019 (approximate).  Physical Exam Constitutional:      Appearance: Normal appearance. She is normal weight.  HENT:     Head: Normocephalic and atraumatic.  Eyes:     Extraocular Movements: Extraocular movements intact.  Cardiovascular:     Rate and Rhythm: Normal rate and regular rhythm.     Heart sounds: Normal heart sounds.  Pulmonary:  Effort: Pulmonary effort is normal.     Breath sounds: Normal breath sounds.  Abdominal:     General: Bowel sounds are normal.     Palpations: Abdomen is soft.  Genitourinary:    General: Normal vulva.     Vagina: Vaginal discharge present.  Musculoskeletal:        General: Normal range of motion.     Cervical back: Normal range of motion and neck supple.  Skin:    General: Skin is warm and dry.  Neurological:     General: No focal deficit present.     Mental Status: She is alert and oriented to person, place, and time.  Psychiatric:        Mood and Affect: Mood normal.        Behavior: Behavior normal.        Thought Content: Thought content normal.        Judgment: Judgment normal.      Assessment: 24 y.o. G1P1001 - for vaginal discharge- likely yeast vaginitis. Additional problem- need for contraception plan   Plan: Problem List Items Addressed This Visit      Other   Vaginal discharge   Relevant Orders   NuSwab Vaginitis Plus (VG+)    Other Visit Diagnoses    Vaginal odor    -  Primary   Relevant Orders   NuSwab Vaginitis Plus (VG+)   Contraceptive education       Relevant Medications    norelgestromin-ethinyl estradiol (ORTHO EVRA) 150-35 MCG/24HR transdermal patch   Encounter for initial prescription of transdermal patch hormonal contraceptive device         Plan: Approximately 35 minutes was spent with this patient providing evaluation, PE, and counsel regarding her sxs and decision making around contraception.  She has decided to trial the [facility] patch. Rx for generic ortho Evra patch sent to the pharmacy. Risks and benefits of the methods carefully explained. She will start with the partch today, as she is just ended her period. Nuswab sent for evaluation of vaginal discharge. She will use an OTC Monistat product, and will be contacted with the results of her Nuswab should she need another RX. RTC in 3 months for f/u on the patches. I have encouraged her to all if she is unhappy with the method before then. Aslo instructed to abstain until she has used at least three patches.  Mirna Mires, CNM  09/28/2019 5:29 PM

## 2019-10-01 LAB — NUSWAB VAGINITIS PLUS (VG+)
Candida albicans, NAA: NEGATIVE
Candida glabrata, NAA: NEGATIVE
Chlamydia trachomatis, NAA: NEGATIVE
Neisseria gonorrhoeae, NAA: NEGATIVE
Trich vag by NAA: NEGATIVE

## 2019-10-02 ENCOUNTER — Encounter: Payer: Self-pay | Admitting: Obstetrics

## 2020-01-01 ENCOUNTER — Ambulatory Visit: Payer: Medicaid Other | Admitting: Obstetrics

## 2020-01-03 ENCOUNTER — Other Ambulatory Visit: Payer: Self-pay

## 2020-01-03 ENCOUNTER — Ambulatory Visit (INDEPENDENT_AMBULATORY_CARE_PROVIDER_SITE_OTHER): Payer: Medicaid Other | Admitting: Obstetrics

## 2020-01-03 ENCOUNTER — Encounter: Payer: Self-pay | Admitting: Obstetrics

## 2020-01-03 DIAGNOSIS — Z3009 Encounter for other general counseling and advice on contraception: Secondary | ICD-10-CM | POA: Diagnosis not present

## 2020-01-03 MED ORDER — NORELGESTROMIN-ETH ESTRADIOL 150-35 MCG/24HR TD PTWK: 1.0000 | MEDICATED_PATCH | TRANSDERMAL | 12 refills | Status: DC

## 2020-01-03 NOTE — Progress Notes (Signed)
Obstetrics & Gynecology Office Visit   Chief Complaint:  Chief Complaint  Patient presents with  . Follow-up    History of Present Illness: Ruth Griffith returns for follow up on starting on the Contraceptive patch for [facility]. She was seen as a new patient last month for a problem visit and also desired to contracept. After reviewing all options, she chose the patch.   Review of Systems:  Review of Systems  All other systems reviewed and are negative.    Past Medical History:  Past Medical History:  Diagnosis Date  . Bipolar 1 disorder (HCC)   . Cannabis use disorder, moderate, dependence (HCC) 11/30/2016  . Dermatitis, seborrheic    also known as Tinea or Pityriasis versicolor (fungal skin infection )  . Encounter for elective induction of labor 03/04/2019  . Normal vaginal delivery 03/08/2019  . Postpartum care following vaginal delivery 03/08/2019  . Supervision of other normal pregnancy, antepartum 08/10/2018   [facility] Prenatal Labs Dating By LMP c/w 10w u/s Blood type: O/Positive/-- (07/06 1423)  Genetic Screen  NIPS: Normal XX Antibody:Negative (07/06 1423) Anatomic [place] normal Rubella: 6.01 (07/06 1423) Varicella: Immune GTT  Third trimester: 131 RPR: Non Reactive (11/06 1122)  Rhogam  not needed HBsAg: Negative (07/06 1423)  TDaP vaccine   12/11                     Flu Shot: Declined HIV: Non R  . Tear of meniscus of left knee     Past Surgical History:  Past Surgical History:  Procedure Laterality Date  . KNEE ARTHROSCOPY WITH MEDIAL MENISECTOMY Left 01/30/2014   Procedure: LEFT KNEE ARTHROSCOPY WITH PLICA RESECTION;  Surgeon: Eugenia Mcalpine, MD;  Location: [facility];  Service: Orthopedics;  Laterality: Left;    Gynecologic History: Patient's last menstrual period was 12/19/2019.  Obstetric History: G1P1001  Family History:  No family history on file.  Social History:  Social History   Socioeconomic History  . Marital status: Single    Spouse name:  Not on file  . Number of children: 1  . Years of education: Not on file  . Highest education level: Not on file  Occupational History  . Not on file  Tobacco Use  . Smoking status: Passive Smoke Exposure - Never Smoker  . Smokeless tobacco: Never Used  Vaping Use  . Vaping Use: Never used  Substance and Sexual Activity  . Alcohol use: Not Currently  . Drug use: Not Currently    Types: Marijuana    Comment: Unknown related to AMS  . Sexual activity: Not Currently    Birth control/protection: None  Other Topics Concern  . Not on file  Social History Narrative   ** Merged History Encounter **       Father smokes.  Lives with both parents.  No family anesthesia problems     Social Determinants of Health   Financial Resource Strain:   . Difficulty of Paying Living Expenses: Not on file  Food Insecurity:   . Worried About Programme researcher, broadcasting/film/video in the Last Year: Not on file  . Ran Out of Food in the Last Year: Not on file  Transportation Needs:   . Lack of Transportation (Medical): Not on file  . Lack of Transportation (Non-Medical): Not on file  Physical Activity:   . Days of Exercise per Week: Not on file  . Minutes of Exercise per Session: Not on file  Stress:   .  Feeling of Stress : Not on file  Social Connections:   . Frequency of Communication with Friends and Family: Not on file  . Frequency of Social Gatherings with Friends and Family: Not on file  . Attends Religious Services: Not on file  . Active Member of Clubs or Organizations: Not on file  . Attends Banker Meetings: Not on file  . Marital Status: Not on file  Intimate Partner Violence:   . Fear of Current or Ex-Partner: Not on file  . Emotionally Abused: Not on file  . Physically Abused: Not on file  . Sexually Abused: Not on file    Allergies:  No Known Allergies  Medications: Prior to Admission medications   Medication Sig Start Date End Date Taking? Authorizing Provider    divalproex (DEPAKOTE) 250 MG DR tablet Take 250 mg by mouth every morning. 08/28/19   [provider]  divalproex (DEPAKOTE) 500 MG DR tablet Take 500 mg by mouth at bedtime. 09/26/19   [provider]  ferrous sulfate 325 (65 FE) MG tablet Take 1 tablet (325 mg total) by mouth 2 (two) times daily with a meal. 03/08/19   Farrel Conners, CNM  hydrOXYzine (ATARAX/VISTARIL) 25 MG tablet Take 25 mg by mouth 2 (two) times daily as needed. Patient not taking: Reported on 09/28/2019 08/08/19   [provider]  norelgestromin-ethinyl estradiol (ORTHO EVRA) 150-35 MCG/24HR transdermal patch Place 1 patch onto the skin once a week. 09/28/19   Mirna Mires, CNM  QUEtiapine (SEROQUEL) 50 MG tablet Take 50 mg by mouth at bedtime. 09/26/19   [provider]    Physical Exam Vitals:  Vitals:   01/03/20 1401  BP: 118/74   Patient's last menstrual period was 12/19/2019.  Physical Exam Constitutional:      Appearance: Normal appearance. She is normal weight.  HENT:     Head: Normocephalic and atraumatic.     Nose: Nose normal.  Eyes:     Extraocular Movements: Extraocular movements intact.     Pupils: Pupils are equal, round, and reactive to light.  Cardiovascular:     Rate and Rhythm: Normal rate and regular rhythm.     Pulses: Normal pulses.     Heart sounds: Normal heart sounds.  Pulmonary:     Effort: Pulmonary effort is normal.     Breath sounds: Wheezing present.     Comments: Some slight wheezing noted on right lung- she is a smoker Abdominal:     General: Bowel sounds are normal.     Palpations: Abdomen is soft.  Genitourinary:    Comments: Deferred  Musculoskeletal:        General: Normal range of motion.     Cervical back: Normal range of motion and neck supple.  Skin:    General: Skin is warm and dry.  Neurological:     Mental Status: She is alert and oriented to person, place, and time.  Psychiatric:        Mood and Affect: Mood normal.         Behavior: Behavior normal.       Assessment: 24 y.o. G1P1001  Follow up on new contraceptive method.  Generic Ortho Evra Patch.   Plan: Problem List Items Addressed This Visit    None    Visit Diagnoses    Contraceptive education         She will continue on the patch, and the prescription is renewed for one full year. Encouraged to have annual  Well Woman visits annually.  Time spent in caring for this patient: 20 minutes.  Mirna Mires, CNM  01/03/2020 4:57 PM

## 2020-02-27 ENCOUNTER — Encounter: Payer: Self-pay | Admitting: Obstetrics and Gynecology

## 2020-02-27 ENCOUNTER — Ambulatory Visit (INDEPENDENT_AMBULATORY_CARE_PROVIDER_SITE_OTHER): Payer: Medicaid Other | Admitting: Obstetrics and Gynecology

## 2020-02-27 ENCOUNTER — Other Ambulatory Visit: Payer: Self-pay

## 2020-02-27 VITALS — BP 110/80 | Ht 69.0 in | Wt 191.0 lb

## 2020-02-27 DIAGNOSIS — E049 Nontoxic goiter, unspecified: Secondary | ICD-10-CM

## 2020-02-27 DIAGNOSIS — Z1329 Encounter for screening for other suspected endocrine disorder: Secondary | ICD-10-CM

## 2020-02-27 DIAGNOSIS — R1033 Periumbilical pain: Secondary | ICD-10-CM

## 2020-02-27 DIAGNOSIS — N888 Other specified noninflammatory disorders of cervix uteri: Secondary | ICD-10-CM

## 2020-02-27 NOTE — Patient Instructions (Signed)
I value your feedback and you entrusting us with your care. If you get a [facility] patient survey, I would appreciate you taking the time to let us know about your experience today. Thank you! ? ? ?

## 2020-02-27 NOTE — Progress Notes (Signed)
Patient, No Pcp Per   Chief Complaint  Patient presents with  . Vaginal Exam    Noticed 3 days ago bump inside vagina, no pain  . Abdominal Pain    All over stomach, feels nauseous, empty stomach x 1 week    HPI:      [name] is a 25 y.o. G1P1001 whose LMP was Patient's last menstrual period was 02/12/2020 (approximate)., presents today for a bump inside the vagina that she noticed on self exam a few days ago. No pain, bleeding. Hasn't felt before.  Has noticed an enlarged thyroid and would like to get it checked. FH thyroid nodule in her mom.  Has had abd pain the past 1- 1 1/2 wks. Pain are sharp/achy; no aggrav/allev factors. Had nausea for 2 days that resolved. Has solid BM quickly after eating. No diarrhea/loose stools/constipation, no rectal bleeding. No recent NSAIDs, no change in meds. Is eating less for wt loss and may take some meds on emptier stomach.  She is not sex active, no urin or vag sx.  On xulane, monthly menses, lasting 5 days, no BTB, mild dysmen. Neg STD testing 8/21.   Past Medical History:  Diagnosis Date  . Bipolar 1 disorder (HCC)   . Cannabis use disorder, moderate, dependence (HCC) 11/30/2016  . Dermatitis, seborrheic    also known as Tinea or Pityriasis versicolor (fungal skin infection )  . Encounter for elective induction of labor 03/04/2019  . Normal vaginal delivery 03/08/2019  . Postpartum care following vaginal delivery 03/08/2019  . Supervision of other normal pregnancy, antepartum 08/10/2018   Clinic [facility] Prenatal Labs Dating By LMP c/w 10w u/s Blood type: O/Positive/-- (07/06 1423)  Genetic Screen  NIPS: Normal XX Antibody:Negative (07/06 1423) Anatomic [place] normal Rubella: 6.01 (07/06 1423) Varicella: Immune GTT  Third trimester: 131 RPR: Non Reactive (11/06 1122)  Rhogam  not needed HBsAg: Negative (07/06 1423)  TDaP vaccine   12/11                     Flu Shot: Declined HIV: Non R  . Tear of meniscus of left knee     Past  Surgical History:  Procedure Laterality Date  . KNEE ARTHROSCOPY WITH MEDIAL MENISECTOMY Left 01/30/2014   Procedure: LEFT KNEE ARTHROSCOPY WITH PLICA RESECTION;  Surgeon: Eugenia Mcalpine, MD;  Location: [facility];  Service: Orthopedics;  Laterality: Left;    History reviewed. No pertinent family history.  Social History   Socioeconomic History  . Marital status: Single    Spouse name: Not on file  . Number of children: 1  . Years of education: Not on file  . Highest education level: Not on file  Occupational History  . Not on file  Tobacco Use  . Smoking status: Passive Smoke Exposure - Never Smoker  . Smokeless tobacco: Never Used  Vaping Use  . Vaping Use: Never used  Substance and Sexual Activity  . Alcohol use: Not Currently  . Drug use: Not Currently    Types: Marijuana    Comment: Unknown related to AMS  . Sexual activity: Not Currently    Birth control/protection: Patch  Other Topics Concern  . Not on file  Social History Narrative   ** Merged History Encounter **       Father smokes.  Lives with both parents.  No family anesthesia problems     Social Determinants of Health   Financial Resource Strain: Not on  file  Food Insecurity: Not on file  Transportation Needs: Not on file  Physical Activity: Not on file  Stress: Not on file  Social Connections: Not on file  Intimate Partner Violence: Not on file    Outpatient Medications Prior to Visit  Medication Sig Dispense Refill  . Ascorbic Acid (VITAMIN C PO) Take by mouth.    . divalproex (DEPAKOTE) 250 MG DR tablet Take 250 mg by mouth every morning.    . divalproex (DEPAKOTE) 500 MG DR tablet Take 500 mg by mouth at bedtime.    . hydrOXYzine (ATARAX/VISTARIL) 25 MG tablet Take 25 mg by mouth 2 (two) times daily as needed.    . norelgestromin-ethinyl estradiol (ORTHO EVRA) 150-35 MCG/24HR transdermal patch Place 1 patch onto the skin once a week. 3 patch 12  . QUEtiapine (SEROQUEL) 50  MG tablet Take 50 mg by mouth at bedtime.    . ferrous sulfate 325 (65 FE) MG tablet Take 1 tablet (325 mg total) by mouth 2 (two) times daily with a meal. 60 tablet 1   No facility-administered medications prior to visit.      ROS:  Review of Systems  Constitutional: Positive for appetite change. Negative for fever, malaise/fatigue and weight loss.  Gastrointestinal: Positive for nausea. Negative for blood in stool, constipation, diarrhea and vomiting.  Genitourinary: Negative for dyspareunia, dysuria, flank pain, frequency, hematuria, urgency, vaginal bleeding, vaginal discharge and vaginal pain.  Musculoskeletal: Negative for back pain.  Skin: Negative for itching and rash.  Psychiatric/Behavioral: Positive for agitation.    OBJECTIVE:   Vitals:  BP 110/80   Ht 5\' 9"  (1.753 m)   Wt 191 lb (86.6 kg)   LMP 02/12/2020 (Approximate)   Breastfeeding No   BMI 28.21 kg/m   Physical Exam Vitals reviewed.  Constitutional:      Appearance: She is well-developed.  Neck:     Thyroid: Thyromegaly present.  Pulmonary:     Effort: Pulmonary effort is normal.  Abdominal:     Palpations: Abdomen is soft.     Tenderness: There is abdominal tenderness in the periumbilical area. There is no guarding or rebound. Negative signs include Murphy's sign and Rovsing's sign.    Genitourinary:    General: Normal vulva.     Pubic Area: No rash.      Labia:        Right: No rash, tenderness or lesion.        Left: No rash, tenderness or lesion.      Vagina: Normal. No vaginal discharge, erythema or tenderness.     Cervix: Lesion present.     Uterus: Normal. Not enlarged and not tender.      Adnexa: Right adnexa normal and left adnexa normal.       Right: No mass or tenderness.         Left: No mass or tenderness.       Musculoskeletal:        General: Normal range of motion.     Cervical back: Normal range of motion.  Skin:    General: Skin is warm and dry.  Neurological:      General: No focal deficit present.     Mental Status: She is alert and oriented to person, place, and time.  Psychiatric:        Mood and Affect: Mood normal.        Behavior: Behavior normal.        Thought Content: Thought content normal.  Judgment: Judgment normal.     Assessment/Plan: Cervical cyst--on exam, reassurance. F/u prn.   Periumbilical abdominal pain--slightly tender on exam. Question etiology. Make sure to take meds on full stomach. F/u with PCP prn  Enlarged thyroid - Plan: TSH; check labs. If neg, will check thyroid u/s.   Thyroid disorder screening - Plan: TSH     Return if symptoms worsen or fail to improve.  Demonte Dobratz B. Billie Trager, PA-C 02/27/2020 9:19 AM

## 2020-02-28 LAB — TSH: TSH: 1.94 u[IU]/mL (ref 0.450–4.500)

## 2020-02-28 NOTE — Progress Notes (Signed)
Thyroid u/s order. Not sure if you get them in your basket.

## 2020-02-28 NOTE — Addendum Note (Signed)
Addended by: Althea Grimmer B on: 02/28/2020 08:07 AM   Modules accepted: Orders

## 2020-03-05 ENCOUNTER — Ambulatory Visit: Admission: RE | Admit: 2020-03-05 | Payer: Medicaid Other | Source: Ambulatory Visit

## 2020-04-03 ENCOUNTER — Other Ambulatory Visit: Payer: Self-pay

## 2020-04-03 ENCOUNTER — Ambulatory Visit
Admission: RE | Admit: 2020-04-03 | Discharge: 2020-04-03 | Disposition: A | Discharge status: Home / Self Care | Payer: Medicaid Other | Source: Ambulatory Visit | Attending: Obstetrics and Gynecology | Admitting: Obstetrics and Gynecology

## 2020-04-03 DIAGNOSIS — E049 Nontoxic goiter, unspecified: Secondary | ICD-10-CM | POA: Diagnosis not present

## 2020-11-11 ENCOUNTER — Other Ambulatory Visit: Payer: Self-pay | Admitting: Obstetrics

## 2020-11-11 DIAGNOSIS — Z3009 Encounter for other general counseling and advice on contraception: Secondary | ICD-10-CM

## 2021-02-03 ENCOUNTER — Other Ambulatory Visit: Payer: Self-pay | Admitting: Obstetrics

## 2021-02-03 DIAGNOSIS — Z3009 Encounter for other general counseling and advice on contraception: Secondary | ICD-10-CM

## 2021-02-05 ENCOUNTER — Other Ambulatory Visit: Payer: Self-pay | Admitting: Obstetrics

## 2021-02-05 NOTE — Telephone Encounter (Signed)
Pateint is requesting a renewal of her contraceptive patch. She is due for an annual Gyn physical. I have renewed her for a 3 month supply, but she will need to make an appt for a physical before I can give her more.

## 2021-05-28 ENCOUNTER — Other Ambulatory Visit: Payer: Self-pay | Admitting: Obstetrics

## 2021-05-28 DIAGNOSIS — Z3009 Encounter for other general counseling and advice on contraception: Secondary | ICD-10-CM

## 2021-07-21 ENCOUNTER — Other Ambulatory Visit: Payer: Self-pay | Admitting: Obstetrics

## 2021-07-21 DIAGNOSIS — Z3009 Encounter for other general counseling and advice on contraception: Secondary | ICD-10-CM

## 2021-09-26 ENCOUNTER — Other Ambulatory Visit: Payer: Self-pay | Admitting: Obstetrics

## 2021-09-26 DIAGNOSIS — Z3009 Encounter for other general counseling and advice on contraception: Secondary | ICD-10-CM

## 2021-10-27 ENCOUNTER — Other Ambulatory Visit: Payer: Self-pay | Admitting: Obstetrics

## 2021-10-27 DIAGNOSIS — Z3009 Encounter for other general counseling and advice on contraception: Secondary | ICD-10-CM

## 2021-10-28 ENCOUNTER — Other Ambulatory Visit: Payer: Self-pay | Admitting: Obstetrics

## 2021-10-28 DIAGNOSIS — Z3009 Encounter for other general counseling and advice on contraception: Secondary | ICD-10-CM

## 2021-10-28 MED ORDER — ZAFEMY 150-35 MCG/24HR TD PTWK: 1.0000 | MEDICATED_PATCH | TRANSDERMAL | 0 refills | Status: DC

## 2021-11-24 ENCOUNTER — Other Ambulatory Visit (HOSPITAL_COMMUNITY)
Admission: RE | Admit: 2021-11-24 | Discharge: 2021-11-24 | Disposition: A | Discharge status: Home / Self Care | Payer: Medicaid Other | Source: Ambulatory Visit | Attending: Licensed Practical Nurse | Admitting: Licensed Practical Nurse

## 2021-11-24 ENCOUNTER — Encounter: Payer: Self-pay | Admitting: Licensed Practical Nurse

## 2021-11-24 ENCOUNTER — Ambulatory Visit (INDEPENDENT_AMBULATORY_CARE_PROVIDER_SITE_OTHER): Payer: Medicaid Other | Admitting: Licensed Practical Nurse

## 2021-11-24 VITALS — BP 142/89 | HR 80 | Ht 69.0 in | Wt 184.0 lb

## 2021-11-24 DIAGNOSIS — Z131 Encounter for screening for diabetes mellitus: Secondary | ICD-10-CM

## 2021-11-24 DIAGNOSIS — Z01411 Encounter for gynecological examination (general) (routine) with abnormal findings: Secondary | ICD-10-CM

## 2021-11-24 DIAGNOSIS — N898 Other specified noninflammatory disorders of vagina: Secondary | ICD-10-CM

## 2021-11-24 DIAGNOSIS — Z124 Encounter for screening for malignant neoplasm of cervix: Secondary | ICD-10-CM | POA: Insufficient documentation

## 2021-11-24 DIAGNOSIS — Z01419 Encounter for gynecological examination (general) (routine) without abnormal findings: Secondary | ICD-10-CM | POA: Insufficient documentation

## 2021-11-24 DIAGNOSIS — Z13220 Encounter for screening for lipoid disorders: Secondary | ICD-10-CM

## 2021-11-24 NOTE — Progress Notes (Signed)
Gynecology Annual Exam  PCP: Patient, No Pcp Per  Chief Complaint:  Chief Complaint  Patient presents with   Gynecologic Exam    History of Present Illness: Patient is a 26 y.o. G1P1001 presents for annual exam. The patient has had chest tightness, wheezing and mucus production for the last 5 days. She has tried mucinex but it interfered with her pysch meds. Has not taken Sudafed. Denies hx of Asthma, does not smoke  -vaginal odor x 1 week, she has had BV and it does not have that odor, denies vaginal irritation or change to discharge  -Concern for UTI, she has noticed an odor in her urine x1 week, has had lower abdominal cramps otherwise denies urinary symptoms   LMP: Patient's last menstrual period was 11/19/2021 (exact date).  Average Interval: regular,  monthly  Duration of flow:  4-5 days  days Heavy Menses: yes first 2 days, changes super tampon 4-5 times per day  Clots: no Intermenstrual Bleeding: no Postcoital Bleeding: not applicable Dysmenorrhea: yes and changes in mood   The patient is not sexually active. She currently uses Ortho-Evra patches weekly for contraception. She NA dyspareunia.  The patient does perform self breast exams.  There is no notable family history of breast or ovarian cancer in her family.  The patient wears seatbelts: yes.  The patient has regular exercise: yes.  Treadmill   The patient repots current symptoms of depression.  Managed with medication  Works in [employer] with Daughter Stress is low Does not have a PCP    Review of Systems: Review of Systems  Constitutional: Negative.   HENT: Negative.    Eyes: Negative.   Respiratory:  Positive for cough and wheezing.   Cardiovascular:  Positive for chest pain.       Tightness in chest, the tightness is not in one spot it "moves", denies pressure or the sensation like an elephant is on her chest.  Gastrointestinal: Negative.   Genitourinary:        Urinary odor x 1 week   Musculoskeletal: Negative.   Skin: Negative.   Neurological: Negative.   Endo/Heme/Allergies:  Positive for environmental allergies.  Psychiatric/Behavioral:  Positive for depression. The patient is nervous/anxious.     Past Medical History:  Patient Active Problem List   Diagnosis Date Noted   Enlarged thyroid 02/27/2020   Vaginal discharge 09/28/2019    8/26 2021. NUswab sent.    Bipolar affective disorder, current episode mixed (HCC)    Bipolar I disorder, most recent episode mixed, severe with psychotic features (HCC) 11/30/2016    Past Surgical History:  Past Surgical History:  Procedure Laterality Date   KNEE ARTHROSCOPY WITH MEDIAL MENISECTOMY Left 01/30/2014   Procedure: LEFT KNEE ARTHROSCOPY WITH PLICA RESECTION;  Surgeon: Eugenia Mcalpine, MD;  Location: [facility];  Service: Orthopedics;  Laterality: Left;    Gynecologic History:  Patient's last menstrual period was 11/19/2021 (exact date). Contraception: Ortho-Evra patches weekly Last Pap: Results were: 08/2018 no abnormalities   Obstetric History: G1P1001  Family History:  History reviewed. No pertinent family history.  Social History:  Social History   Socioeconomic History   Marital status: Single    Spouse name: Not on file   Number of children: 1   Years of education: Not on file   Highest education level: Not on file  Occupational History   Not on file  Tobacco Use   Smoking status: Never    Passive exposure: Yes  Smokeless tobacco: Never  Vaping Use   Vaping Use: Never used  Substance and Sexual Activity   Alcohol use: Yes   Drug use: Not Currently    Types: Marijuana    Comment: Unknown related to AMS   Sexual activity: Not Currently    Birth control/protection: Patch  Other Topics Concern   Not on file  Social History Narrative   ** Merged History Encounter **       Father smokes.  Lives with both parents.  No family anesthesia problems     Social  Determinants of Health   Financial Resource Strain: Not on file  Food Insecurity: Not on file  Transportation Needs: Not on file  Physical Activity: Not on file  Stress: Not on file  Social Connections: Not on file  Intimate Partner Violence: Not on file    Allergies:  No Known Allergies  Medications: Prior to Admission medications   Medication Sig Start Date End Date Taking? Authorizing Provider  hydrOXYzine (ATARAX/VISTARIL) 25 MG tablet Take 25 mg by mouth 2 (two) times daily as needed. 08/08/19  Yes [provider]  norelgestromin-ethinyl estradiol (ZAFEMY) 150-35 MCG/24HR transdermal patch Place 1 patch onto the skin once a week. 10/28/21  Yes Lidwina Kaner, Nunzio Cobbs, CNM  QUEtiapine (SEROQUEL) 50 MG tablet Take 300 mg by mouth at bedtime. 09/26/19  Yes [provider]  Ascorbic Acid (VITAMIN C PO) Take by mouth. Patient not taking: Reported on 11/24/2021    [provider]  divalproex (DEPAKOTE) 250 MG DR tablet Take 250 mg by mouth every morning. Patient not taking: Reported on 11/24/2021 08/28/19   [provider]  divalproex (DEPAKOTE) 500 MG DR tablet Take 500 mg by mouth at bedtime. Patient not taking: Reported on 11/24/2021 09/26/19   [provider]    Physical Exam Vitals: Blood pressure (!) 142/89, pulse 80, height 5\' 9"  (1.753 m), weight 184 lb (83.5 kg), last menstrual period 11/19/2021.  General: NAD HEENT: normocephalic, anicteric Thyroid: no enlargement, no palpable nodules Pulmonary: No increased work of breathing, CTAB Cardiovascular: RRR, distal pulses 2+ Breast: Breast symmetrical, no tenderness, no palpable nodules or masses, no skin or nipple retraction present, no nipple discharge.  No axillary or supraclavicular lymphadenopathy. Abdomen: NABS, soft, non-tender, non-distended.  Umbilicus without lesions.  No hepatomegaly, splenomegaly or masses palpable. No evidence of hernia  Genitourinary:  External: Normal  external female genitalia.  Normal urethral meatus, normal Bartholin's and Skene's glands.    Vagina: Normal vaginal mucosa, no evidence of prolapse.    Cervix: Grossly normal in appearance, menses like discharge present   Uterus: Non-enlarged, mobile, normal contour.  No CMT  Adnexa: ovaries non-enlarged, no adnexal masses  Rectal: deferred  Lymphatic: no evidence of inguinal lymphadenopathy Extremities: no edema, erythema, or tenderness Neurologic: Grossly intact Psychiatric: mood appropriate, affect full   Assessment: 26 y.o. G1P1001 routine annual exam  Plan: Problem List Items Addressed This Visit   None Visit Diagnoses     Well woman exam    -  Primary   Relevant Orders   Cervicovaginal ancillary only   Cytology - PAP   Lipid panel   Hemoglobin A1c   Vaginal odor       Relevant Orders   Cervicovaginal ancillary only   Cervical cancer screening       Relevant Orders   Cytology - PAP   Lipid screening       Relevant Orders   Lipid panel   Screening for diabetes mellitus  Relevant Orders   Hemoglobin A1c       1) 4) Gardasil Series discussed and if applicable offered to patient - Patient \\unsure , will check records previously completed 3 shot series   2) STI screening  wasoffered and declined  3)  ASCCP guidelines and rational discussed.  Patient opts for every 3 years screening interval  4) Contraception - the patient is currently using  Ortho-Evra patches weekly.  She is happy with her current form of contraception and plans to continue We discussed safe sex practices to reduce her furture risk of STI's.    5) Elevated BP reading: rec follow up with PCP on a few weeks for BP check.  6) Pt will return for fasting labs, then in 1 year for annual exam    Carie Caddy, CNM  [facility] 11/25/2021, 11:41 AM

## 2021-11-25 LAB — CERVICOVAGINAL ANCILLARY ONLY
Bacterial Vaginitis (gardnerella): NEGATIVE
Candida Glabrata: NEGATIVE
Candida Vaginitis: NEGATIVE
Comment: NEGATIVE
Comment: NEGATIVE
Comment: NEGATIVE

## 2021-11-26 LAB — CYTOLOGY - PAP
Comment: NEGATIVE
Diagnosis: UNDETERMINED — AB
High risk HPV: NEGATIVE

## 2022-01-08 ENCOUNTER — Other Ambulatory Visit: Payer: Self-pay | Admitting: Licensed Practical Nurse

## 2022-01-08 DIAGNOSIS — Z3009 Encounter for other general counseling and advice on contraception: Secondary | ICD-10-CM

## 2022-03-10 ENCOUNTER — Other Ambulatory Visit: Payer: Self-pay

## 2022-03-10 DIAGNOSIS — Z3009 Encounter for other general counseling and advice on contraception: Secondary | ICD-10-CM

## 2022-05-13 NOTE — Progress Notes (Unsigned)
   There were no vitals taken for this visit.   Subjective:    Patient ID: Ruth Griffith, female    DOB: [birth year], 27 y.o.   MRN: [id number]  HPI: Ruth Griffith is a 27 y.o. female  No chief complaint on file.  Establish care: her last physical was ***.  Medical history includes ***.  Family history includes ***.  Health maintenance ***.   Relevant past medical, surgical, family and social history reviewed and updated as indicated. Interim medical history since our last visit reviewed. Allergies and medications reviewed and updated.  Review of Systems  Constitutional: Negative for fever or weight change.  Respiratory: Negative for cough and shortness of breath.   Cardiovascular: Negative for chest pain or palpitations.  Gastrointestinal: Negative for abdominal pain, no bowel changes.  Musculoskeletal: Negative for gait problem or joint swelling.  Skin: Negative for rash.  Neurological: Negative for dizziness or headache.  No other specific complaints in a complete review of systems (except as listed in HPI above).      Objective:    There were no vitals taken for this visit.  Wt Readings from Last 3 Encounters:  11/24/21 184 lb (83.5 kg)  02/27/20 191 lb (86.6 kg)  01/03/20 195 lb (88.5 kg)    Physical Exam  Constitutional: Patient appears well-developed and well-nourished. Obese *** No distress.  HEENT: head atraumatic, normocephalic, pupils equal and reactive to light, ears ***, neck supple, throat within normal limits Cardiovascular: Normal rate, regular rhythm and normal heart sounds.  No murmur heard. No BLE edema. Pulmonary/Chest: Effort normal and breath sounds normal. No respiratory distress. Abdominal: Soft.  There is no tenderness. Psychiatric: Patient has a normal mood and affect. behavior is normal. Judgment and thought content normal.      Assessment & Plan:   Problem List Items Addressed This Visit   None    Follow up plan: No  follow-ups on file.

## 2022-05-14 ENCOUNTER — Encounter: Payer: Self-pay | Admitting: Nurse Practitioner

## 2022-05-14 ENCOUNTER — Ambulatory Visit (INDEPENDENT_AMBULATORY_CARE_PROVIDER_SITE_OTHER): Payer: Medicaid Other | Admitting: Nurse Practitioner

## 2022-05-14 ENCOUNTER — Other Ambulatory Visit (HOSPITAL_COMMUNITY)
Admission: RE | Admit: 2022-05-14 | Discharge: 2022-05-14 | Disposition: A | Discharge status: Home / Self Care | Payer: Medicaid Other | Source: Ambulatory Visit | Attending: Nurse Practitioner | Admitting: Nurse Practitioner

## 2022-05-14 ENCOUNTER — Other Ambulatory Visit: Payer: Self-pay

## 2022-05-14 VITALS — BP 120/72 | HR 98 | Temp 97.8°F | Resp 16 | Ht 68.5 in | Wt 182.1 lb

## 2022-05-14 DIAGNOSIS — E049 Nontoxic goiter, unspecified: Secondary | ICD-10-CM

## 2022-05-14 DIAGNOSIS — Z13220 Encounter for screening for lipoid disorders: Secondary | ICD-10-CM

## 2022-05-14 DIAGNOSIS — Z0000 Encounter for general adult medical examination without abnormal findings: Secondary | ICD-10-CM | POA: Diagnosis not present

## 2022-05-14 DIAGNOSIS — Z1159 Encounter for screening for other viral diseases: Secondary | ICD-10-CM

## 2022-05-14 DIAGNOSIS — Z114 Encounter for screening for human immunodeficiency virus [HIV]: Secondary | ICD-10-CM | POA: Diagnosis not present

## 2022-05-14 DIAGNOSIS — N898 Other specified noninflammatory disorders of vagina: Secondary | ICD-10-CM | POA: Diagnosis not present

## 2022-05-14 DIAGNOSIS — Z130 Encounter for screening for diseases of the blood and blood-forming organs and certain disorders involving the immune mechanism: Secondary | ICD-10-CM

## 2022-05-14 DIAGNOSIS — F3164 Bipolar disorder, current episode mixed, severe, with psychotic features: Secondary | ICD-10-CM

## 2022-05-14 DIAGNOSIS — Z131 Encounter for screening for diabetes mellitus: Secondary | ICD-10-CM

## 2022-05-15 ENCOUNTER — Other Ambulatory Visit: Payer: Self-pay | Admitting: Nurse Practitioner

## 2022-05-15 DIAGNOSIS — N760 Acute vaginitis: Secondary | ICD-10-CM

## 2022-05-15 LAB — COMPLETE METABOLIC PANEL WITH GFR
AG Ratio: 1.4 (calc) (ref 1.0–2.5)
ALT: 13 U/L (ref 6–29)
AST: 14 U/L (ref 10–30)
Albumin: 4.3 g/dL (ref 3.6–5.1)
Alkaline phosphatase (APISO): 71 U/L (ref 31–125)
BUN: 12 mg/dL (ref 7–25)
CO2: 28 mmol/L (ref 20–32)
Calcium: 9.5 mg/dL (ref 8.6–10.2)
Chloride: 104 mmol/L (ref 98–110)
Creat: 0.83 mg/dL (ref 0.50–0.96)
Globulin: 3.1 g/dL (calc) (ref 1.9–3.7)
Glucose, Bld: 95 mg/dL (ref 65–99)
Potassium: 4.5 mmol/L (ref 3.5–5.3)
Sodium: 138 mmol/L (ref 135–146)
Total Bilirubin: 0.4 mg/dL (ref 0.2–1.2)
Total Protein: 7.4 g/dL (ref 6.1–8.1)
eGFR: 100 mL/min/{1.73_m2} (ref >=60)

## 2022-05-15 LAB — HEMOGLOBIN A1C
Hgb A1c MFr Bld: 5.5 % of total Hgb (ref <5.7)
Mean Plasma Glucose: 111 mg/dL
eAG (mmol/L): 6.2 mmol/L

## 2022-05-15 LAB — CBC WITH DIFFERENTIAL/PLATELET
Absolute Monocytes: 227 cells/uL (ref 200–950)
Basophils Absolute: 32 cells/uL (ref 0–200)
Basophils Relative: 0.6 %
Eosinophils Absolute: 38 cells/uL (ref 15–500)
Eosinophils Relative: 0.7 %
HCT: 38.6 % (ref 35.0–45.0)
Hemoglobin: 12.7 g/dL (ref 11.7–15.5)
Lymphs Abs: 2025 cells/uL (ref 850–3900)
MCH: 28.1 pg (ref 27.0–33.0)
MCHC: 32.9 g/dL (ref 32.0–36.0)
MCV: 85.4 fL (ref 80.0–100.0)
MPV: 11 fL (ref 7.5–12.5)
Monocytes Relative: 4.2 %
Neutro Abs: 3078 cells/uL (ref 1500–7800)
Neutrophils Relative %: 57 %
Platelets: 254 10*3/uL (ref 140–400)
RBC: 4.52 10*6/uL (ref 3.80–5.10)
RDW: 13.5 % (ref 11.0–15.0)
Total Lymphocyte: 37.5 %
WBC: 5.4 10*3/uL (ref 3.8–10.8)

## 2022-05-15 LAB — CERVICOVAGINAL ANCILLARY ONLY
Bacterial Vaginitis (gardnerella): POSITIVE — AB
Candida Glabrata: NEGATIVE
Candida Vaginitis: NEGATIVE
Chlamydia: NEGATIVE
Comment: NEGATIVE
Comment: NEGATIVE
Comment: NEGATIVE
Comment: NEGATIVE
Comment: NEGATIVE
Comment: NORMAL
Neisseria Gonorrhea: NEGATIVE
Trichomonas: NEGATIVE

## 2022-05-15 LAB — HIV ANTIBODY (ROUTINE TESTING W REFLEX): HIV 1&2 Ab, 4th Generation: NONREACTIVE

## 2022-05-15 LAB — LIPID PANEL
Cholesterol: 197 mg/dL (ref <200)
HDL: 74 mg/dL (ref >=50)
LDL Cholesterol (Calc): 98 mg/dL (calc)
Non-HDL Cholesterol (Calc): 123 mg/dL (calc) (ref <130)
Total CHOL/HDL Ratio: 2.7 (calc) (ref <5.0)
Triglycerides: 155 mg/dL — ABNORMAL HIGH (ref <150)

## 2022-05-15 LAB — TSH: TSH: 0.86 mIU/L

## 2022-05-15 LAB — HEPATITIS C ANTIBODY: Hepatitis C Ab: NONREACTIVE

## 2022-05-15 MED ORDER — METRONIDAZOLE 500 MG PO TABS
500.0000 mg | ORAL_TABLET | Freq: Two times a day (BID) | ORAL | 0 refills | Status: AC
Start: 2022-05-15 — End: 2022-05-22

## 2022-09-03 ENCOUNTER — Encounter: Payer: Self-pay | Admitting: Advanced Practice Midwife

## 2022-09-03 ENCOUNTER — Ambulatory Visit: Payer: Self-pay | Admitting: Advanced Practice Midwife

## 2022-09-03 VITALS — BP 137/85 | HR 82

## 2022-09-03 DIAGNOSIS — Z62810 Personal history of physical and sexual abuse in childhood: Secondary | ICD-10-CM

## 2022-09-03 DIAGNOSIS — Z113 Encounter for screening for infections with a predominantly sexual mode of transmission: Secondary | ICD-10-CM | POA: Diagnosis not present

## 2022-09-03 DIAGNOSIS — R87619 Unspecified abnormal cytological findings in specimens from cervix uteri: Secondary | ICD-10-CM | POA: Insufficient documentation

## 2022-09-03 DIAGNOSIS — F419 Anxiety disorder, unspecified: Secondary | ICD-10-CM

## 2022-09-03 DIAGNOSIS — F3160 Bipolar disorder, current episode mixed, unspecified: Secondary | ICD-10-CM

## 2022-09-03 DIAGNOSIS — Z720 Tobacco use: Secondary | ICD-10-CM

## 2022-09-03 DIAGNOSIS — Z30016 Encounter for initial prescription of transdermal patch hormonal contraceptive device: Secondary | ICD-10-CM

## 2022-09-03 LAB — WET PREP FOR TRICH, YEAST, CLUE
Trichomonas Exam: NEGATIVE
Yeast Exam: NEGATIVE

## 2022-09-03 MED ORDER — NORELGESTROMIN-ETH ESTRADIOL 150-35 MCG/24HR TD PTWK: 1.0000 | MEDICATED_PATCH | TRANSDERMAL | 0 refills | Status: DC

## 2022-09-03 NOTE — Progress Notes (Signed)
Pt here for STD screening, requested contraception.  Wet prep results reviewed, no treatment per standing order.  Condoms declined.  The patient was dispensed xulane patch today. I provided counseling today regarding the medication. We discussed the medication, the side effects and when to call clinic. Patient given the opportunity to ask questions. Questions answered.  Gaspar Garbe, RN
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
## 2022-09-04 NOTE — Progress Notes (Signed)
duplicate

## 2022-10-08 ENCOUNTER — Ambulatory Visit (LOCAL_COMMUNITY_HEALTH_CENTER): Payer: Self-pay | Admitting: Family Medicine

## 2022-10-08 ENCOUNTER — Encounter: Payer: Self-pay | Admitting: Family Medicine

## 2022-10-08 VITALS — BP 130/86 | HR 81 | Wt 185.8 lb

## 2022-10-08 DIAGNOSIS — Z113 Encounter for screening for infections with a predominantly sexual mode of transmission: Secondary | ICD-10-CM

## 2022-10-08 DIAGNOSIS — Z3009 Encounter for other general counseling and advice on contraception: Secondary | ICD-10-CM

## 2022-10-08 DIAGNOSIS — Z0000 Encounter for general adult medical examination without abnormal findings: Secondary | ICD-10-CM

## 2022-10-08 LAB — WET PREP FOR TRICH, YEAST, CLUE
Trichomonas Exam: NEGATIVE
Yeast Exam: NEGATIVE

## 2022-10-08 MED ORDER — NORELGESTROMIN-ETH ESTRADIOL 150-35 MCG/24HR TD PTWK
1.0000 | MEDICATED_PATCH | TRANSDERMAL | 11 refills | Status: DC
Start: 2022-10-08 — End: 2023-03-15

## 2022-10-08 NOTE — Progress Notes (Signed)
Pt is here for family planning visit.  Family planning packet reviewed and given to pt.  Wet prep results reviewed, no treatment required per standing orders. Condoms given.  The patient was dispensed 6 boxes of xulane patches today. I provided counseling today regarding the medication. We discussed the medication, the side effects and to call clinic when last box opened.  Patient given the opportunity to ask questions. Questions answered.  Gaspar Garbe, RN

## 2022-10-08 NOTE — Progress Notes (Signed)
-====  t5
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
## 2023-02-06 IMAGING — US US THYROID
1 series · 14 of 25 positions shown · non-contrast
Comparison: None.

CLINICAL DATA: Palpable abnormality.  Thyromegaly.

EXAM:
THYROID ULTRASOUND
TECHNIQUE: Ultrasound examination of the thyroid gland and adjacent soft
tissues was performed.

[Series 1: us thyroid · 0.07mm/px · 14 of 36 slices shown]
[im 1/36]
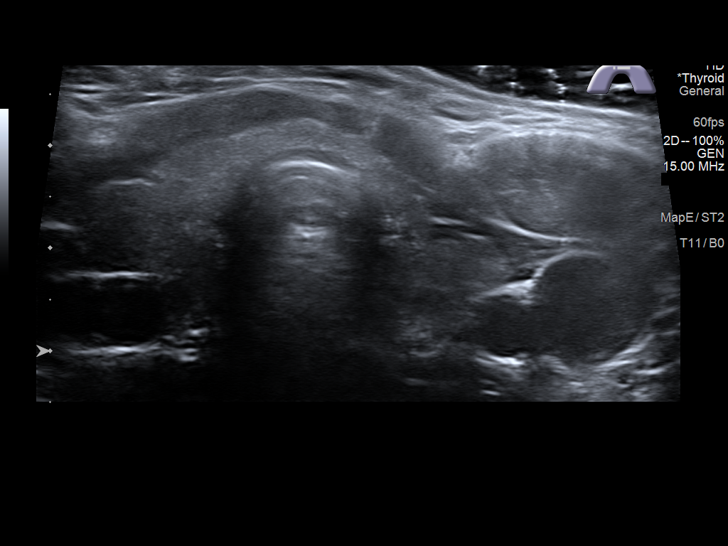
[im 3/36]
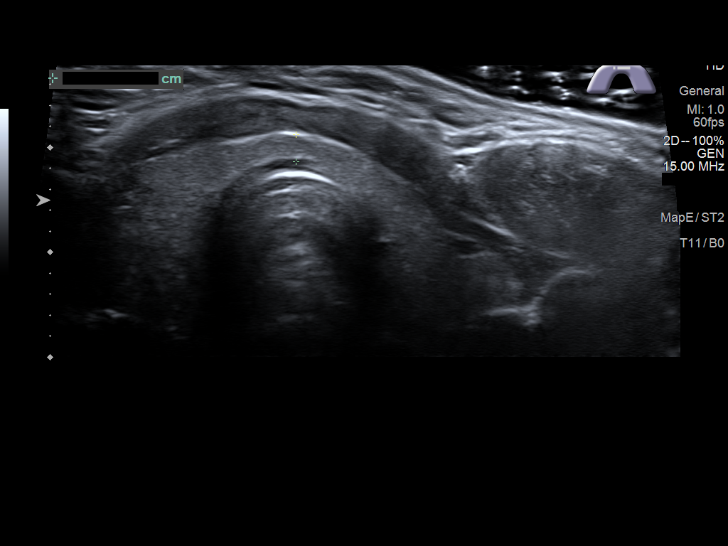
[im 6/36]
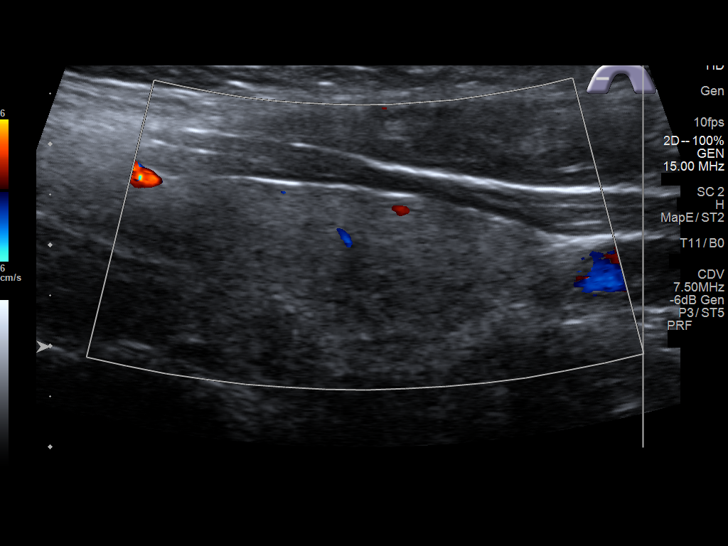
[im 9/36]
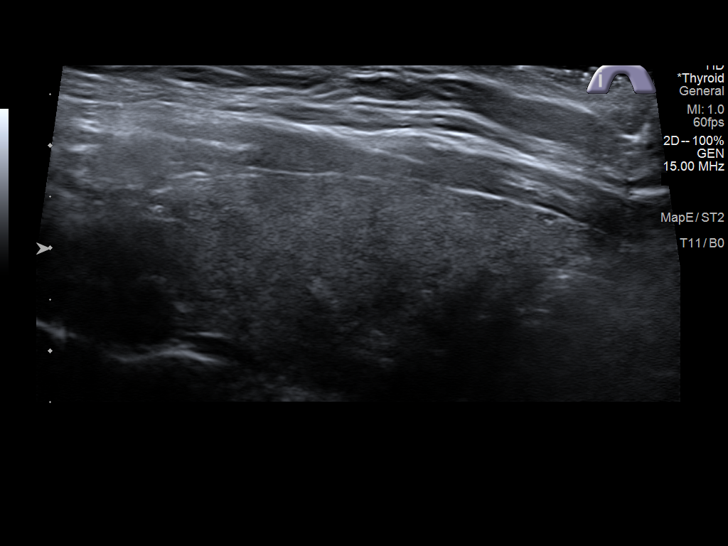
[im 12/36]
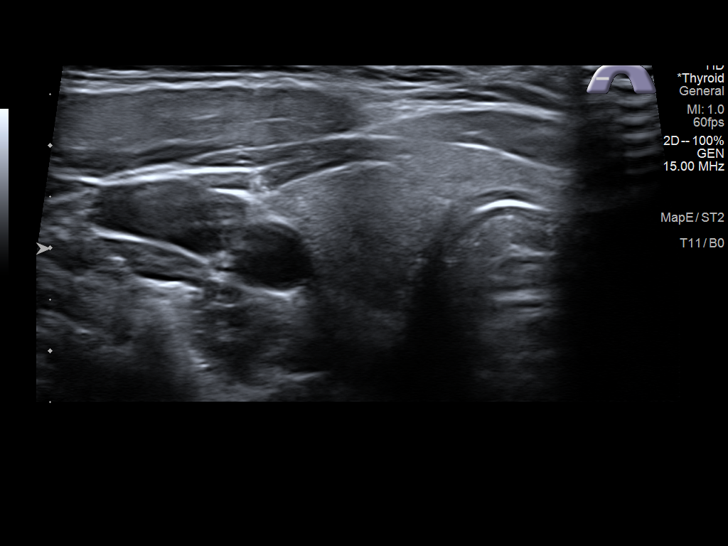
[im 14/36]
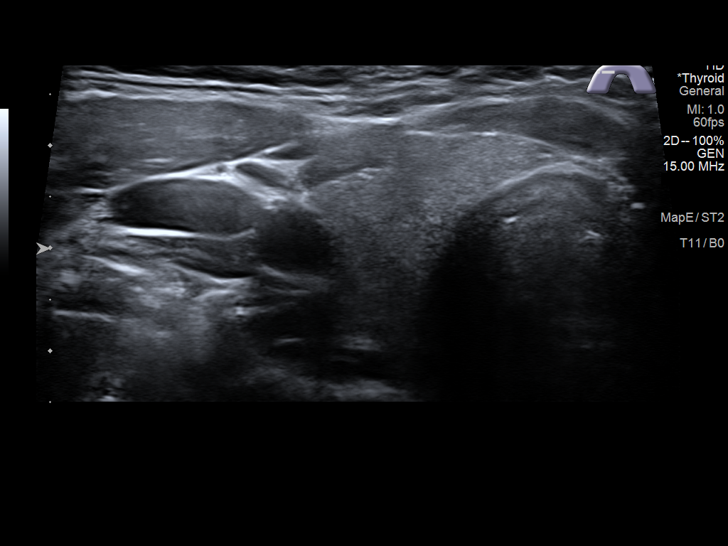
[im 17/36]
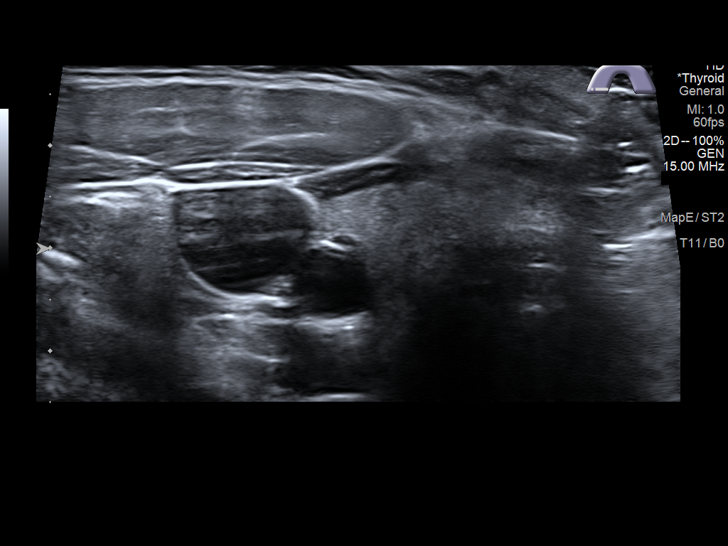
[im 19/36]
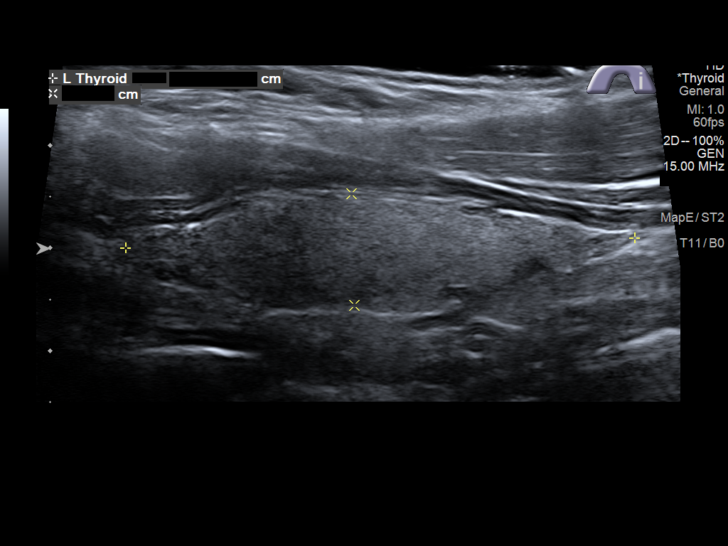
[im 22/36]
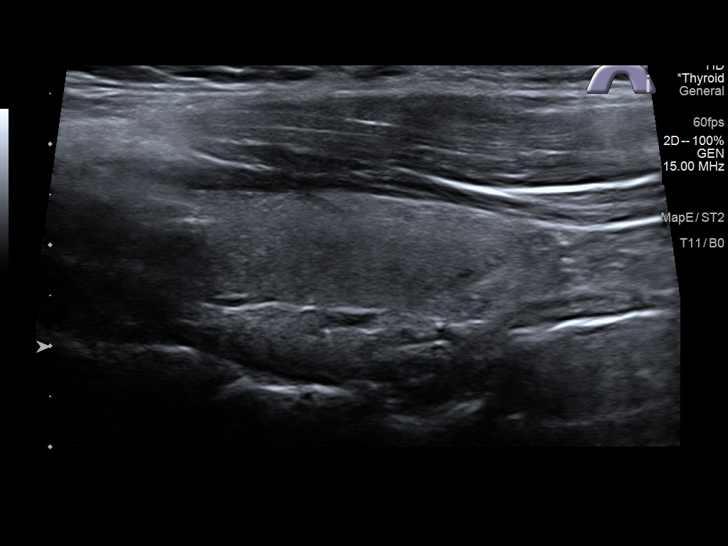
[im 24/36]
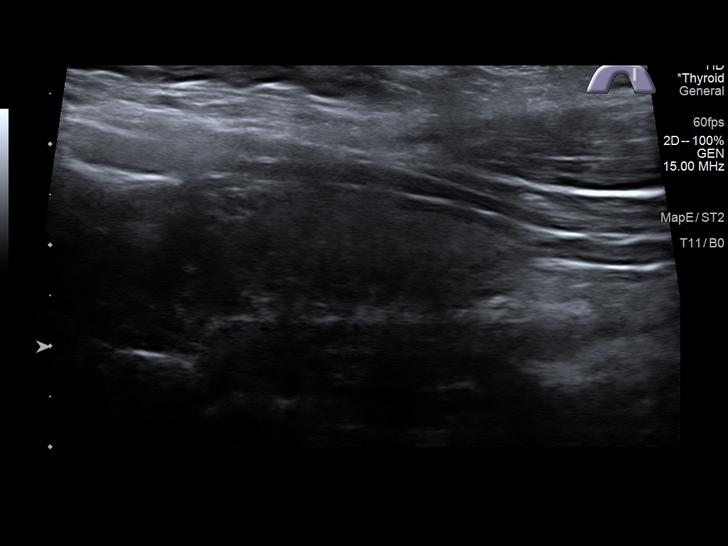
[im 27/36]
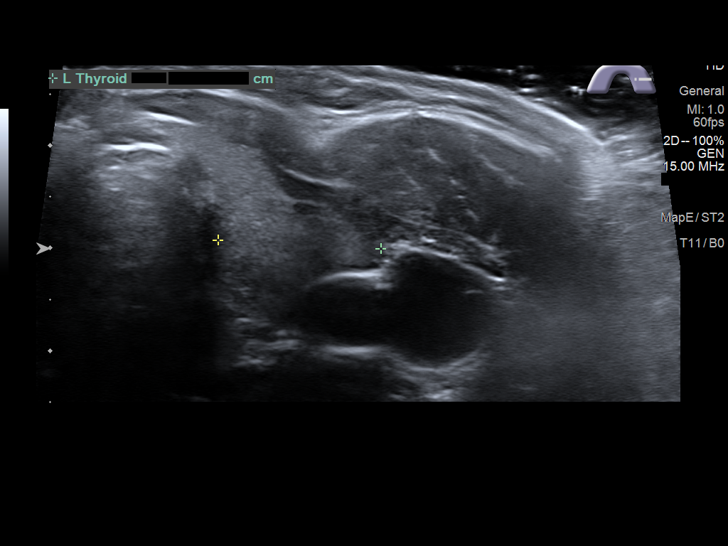
[im 30/36]
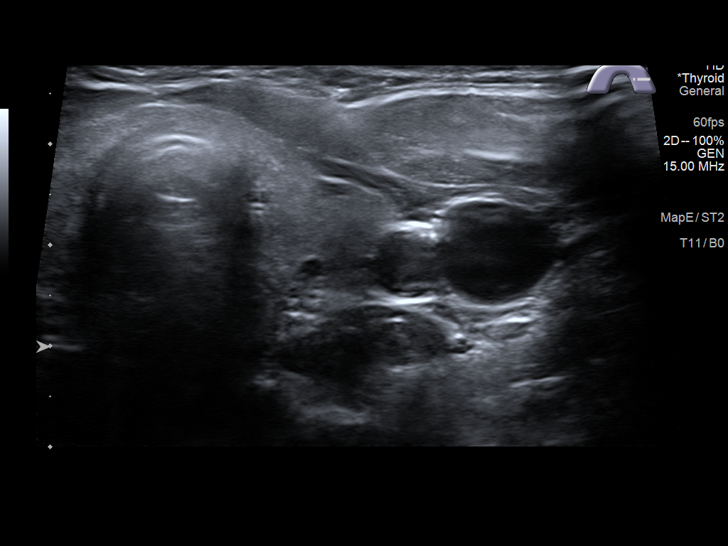
[im 33/36]
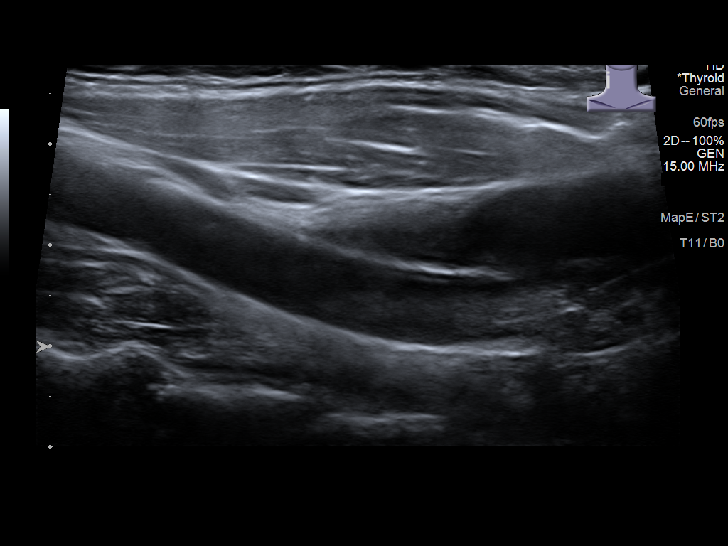
[im 36/36]
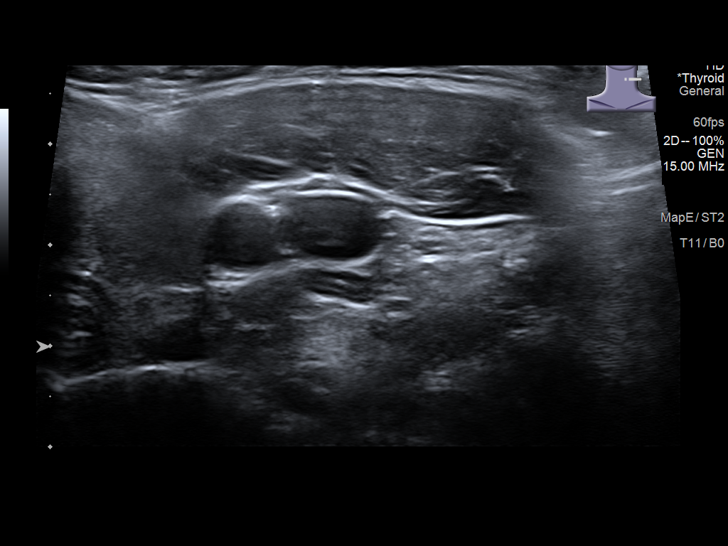

[14 of 25 positions shown; findings below may reference images not displayed]

FINDINGS: Parenchymal Echotexture: Normal

Isthmus: 0.3 cm

Right lobe: 4.3 x 1.3 x 1.8 cm

Left lobe: 5 x 1.1 x 1.6 cm

_________________________________________________________

Estimated total number of nodules >/= 1 cm: 0

Number of spongiform nodules >/=  2 cm not described below (TR1): 0

Number of mixed cystic and solid nodules >/= 1.5 cm not described
below (TR2): 0

_________________________________________________________

No discrete nodules are seen within the thyroid gland.
IMPRESSION: Normal sonographic appearance of the thyroid gland.

## 2023-03-15 ENCOUNTER — Telehealth: Payer: Self-pay | Admitting: Family Medicine

## 2023-03-15 ENCOUNTER — Ambulatory Visit: Payer: No Typology Code available for payment source | Admitting: Nurse Practitioner

## 2023-03-15 VITALS — BP 122/79 | HR 75 | Ht 69.6 in | Wt 181.4 lb

## 2023-03-15 DIAGNOSIS — Z309 Encounter for contraceptive management, unspecified: Secondary | ICD-10-CM

## 2023-03-15 DIAGNOSIS — Z113 Encounter for screening for infections with a predominantly sexual mode of transmission: Secondary | ICD-10-CM

## 2023-03-15 DIAGNOSIS — Z3009 Encounter for other general counseling and advice on contraception: Secondary | ICD-10-CM

## 2023-03-15 LAB — WET PREP FOR TRICH, YEAST, CLUE
Trichomonas Exam: NEGATIVE
Yeast Exam: NEGATIVE

## 2023-03-15 MED ORDER — NORELGESTROMIN-ETH ESTRADIOL 150-35 MCG/24HR TD PTWK: 1.0000 | MEDICATED_PATCH | TRANSDERMAL | 13 refills | Status: AC

## 2023-03-15 MED ORDER — NORELGESTROMIN-ETH ESTRADIOL 150-35 MCG/24HR TD PTWK: 1.0000 | MEDICATED_PATCH | TRANSDERMAL | 13 refills | Status: DC

## 2023-03-15 NOTE — Progress Notes (Signed)
 Patient is here for contraception. The patient was dispensed xulane today. I provided counseling today regarding the patch, the side effects and when to call clinic. Patient given the opportunity to ask questions for any clarifications. Condoms declined. Austine Lefort, RN.
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
## 2023-03-16 NOTE — Telephone Encounter (Signed)
Spoke with the patient and Psychologist, forensic.  Insurance company is denying a refill at this time, stating it is too early.  Pt states that she got this insurance in January and has never had an Rx for [employer] called to her Pharmacy.  She always gets her patches from [place] because she didn't have insurance.   [facility] will be reaching out to [employer] and the patient will also reach out to her [employer]. [id number].  [facility] called back and stated that her Rx had been called to the [facility] [address], as well.  The pharmacist will reach out to the patient and see if she would like to pick it up on [address]  Berdie Ogren, RN

## 2023-03-20 LAB — GONOCOCCUS CULTURE

## 2023-08-16 ENCOUNTER — Ambulatory Visit: Admitting: Nurse Practitioner

## 2023-08-16 ENCOUNTER — Other Ambulatory Visit (HOSPITAL_COMMUNITY)
Admission: RE | Admit: 2023-08-16 | Discharge: 2023-08-16 | Disposition: A | Discharge status: Home / Self Care | Source: Ambulatory Visit | Attending: Nurse Practitioner | Admitting: Nurse Practitioner

## 2023-08-16 ENCOUNTER — Encounter: Payer: Self-pay | Admitting: Nurse Practitioner

## 2023-08-16 VITALS — BP 122/82 | HR 98 | Resp 18 | Ht 69.0 in | Wt 174.6 lb

## 2023-08-16 DIAGNOSIS — N898 Other specified noninflammatory disorders of vagina: Secondary | ICD-10-CM | POA: Diagnosis not present

## 2023-08-16 NOTE — Progress Notes (Signed)
 BP 122/82   Pulse 98   Resp 18   Ht 5' 9 (1.753 m)   Wt 174 lb 9.6 oz (79.2 kg)   LMP 08/09/2023   SpO2 97%   BMI 25.78 kg/m    Subjective:    Patient ID: Ruth Griffith, female    DOB: 1995-12-15, 28 y.o.   MRN: [id number]  HPI: Ruth Griffith is a 28 y.o. female  Chief Complaint  Patient presents with   Vaginal Discharge    With odor and bloating    Discussed the use of AI scribe software for clinical note transcription with the patient, who gave verbal consent to proceed.  History of Present Illness Ruth Griffith is a 28 year old female with a history of bacterial vaginosis who presents with vaginal discharge and odor.  She experiences significant vaginal discharge with an unusual odor. The discharge is clear to whitish and sometimes chunky. She has a history of bacterial vaginosis, typically occurring when sexually active, but she has not been sexually active for about a year. She has not used antibiotics recently and denies douching or using toys.  She started exercising regularly about four days a week since January, which she speculates might be affecting her symptoms. During her last visit, a Pap smear showed ASCUS, but her HPV test was negative. She is due for her next Pap smear next year.  She has been taking probiotics to manage her symptoms and tried a bath with apple cider vinegar on her sister's advice, which temporarily alleviated the odor for about a week, but symptoms returned after her menstrual periods.  No pain, cramping, diarrhea, vomiting, fever, trouble urinating, or burning during urination.         08/16/2023    2:53 PM 10/08/2022   11:36 AM 10/08/2022   10:54 AM  Depression screen PHQ 2/9  Decreased Interest 0 0 1  Down, Depressed, Hopeless 0 2 1  PHQ - 2 Score 0 2 2  Altered sleeping 0 0   Tired, decreased energy 0 0   Change in appetite 0 0   Feeling bad or failure about yourself  0 0   Trouble concentrating 0 1    Moving slowly or fidgety/restless 0 0   Suicidal thoughts 0 0   PHQ-9 Score 0 3   Difficult doing work/chores Not difficult at all      Relevant past medical, surgical, family and social history reviewed and updated as indicated. Interim medical history since our last visit reviewed. Allergies and medications reviewed and updated.  Review of Systems  Ten systems reviewed and is negative except as mentioned in HPI      Objective:     BP 122/82   Pulse 98   Resp 18   Ht 5' 9 (1.753 m)   Wt 174 lb 9.6 oz (79.2 kg)   LMP 08/09/2023   SpO2 97%   BMI 25.78 kg/m    Wt Readings from Last 3 Encounters:  08/16/23 174 lb 9.6 oz (79.2 kg)  03/15/23 181 lb 6.4 oz (82.3 kg)  10/08/22 185 lb 12.8 oz (84.3 kg)    Physical Exam Physical Exam MEASUREMENTS: Weight- 123. GENERAL: Alert, cooperative, well developed, no acute distress HEENT: Normocephalic, normal oropharynx, moist mucous membranes CHEST: Clear to auscultation bilaterally, No wheezes, rhonchi, or crackles CARDIOVASCULAR: Normal heart rate and rhythm, S1 and S2 normal without murmurs ABDOMEN: Soft, non-tender, non-distended, without organomegaly, Normal bowel sounds EXTREMITIES: No cyanosis or edema NEUROLOGICAL:  Cranial nerves grossly intact, Moves all extremities without gross motor or sensory deficit   Results for orders placed or performed in visit on 03/15/23  WET PREP FOR TRICH, YEAST, CLUE   Collection Time: 03/15/23 12:00 AM  Result Value Ref Range   Trichomonas Exam Negative Negative   Yeast Exam Negative Negative   Clue Cell Exam Comment: Negative  Gonococcus culture   Collection Time: 03/15/23 11:40 AM   Specimen: Pharynx; Throat   PH  Result Value Ref Range   GC Culture Only Final report    Result 1 Comment           Assessment & Plan:   Problem List Items Addressed This Visit   None Visit Diagnoses       Vaginal discharge    -  Primary   Relevant Orders   Cervicovaginal ancillary only         Assessment and Plan Assessment & Plan Vaginal discharge with odor Discharge is clear to whitish, sometimes chunky, with noticeable odor. Symptoms temporarily improved with apple cider vinegar bath but recurred post-menstruation. No associated pain, fever, or urinary symptoms. Differential includes BV, yeast infection, or other vaginal infections. - Perform vaginal swab to determine cause of discharge - Review results by Wednesday - Discuss results via MyChart  ASCUS on Pap smear, HPV negative ASCUS on Pap smear with negative HPV test. Last Pap smear showed atypical squamous cells of undetermined significance (ASCUS) but was not concerning due to negative HPV. - Due for next Pap smear in 2026 as per guidelines        Follow up plan: Return if symptoms worsen or fail to improve.

## 2023-08-18 ENCOUNTER — Ambulatory Visit: Payer: Self-pay | Admitting: Nurse Practitioner

## 2023-08-18 DIAGNOSIS — N760 Acute vaginitis: Secondary | ICD-10-CM

## 2023-08-18 LAB — CERVICOVAGINAL ANCILLARY ONLY
Bacterial Vaginitis (gardnerella): POSITIVE — AB
Candida Glabrata: NEGATIVE
Candida Vaginitis: NEGATIVE
Chlamydia: NEGATIVE
Comment: NEGATIVE
Comment: NEGATIVE
Comment: NEGATIVE
Comment: NEGATIVE
Comment: NEGATIVE
Comment: NORMAL
Neisseria Gonorrhea: NEGATIVE
Trichomonas: NEGATIVE

## 2023-08-18 MED ORDER — METRONIDAZOLE 500 MG PO TABS: 500.0000 mg | ORAL_TABLET | Freq: Two times a day (BID) | ORAL | 0 refills | Status: AC

## 2023-11-16 ENCOUNTER — Ambulatory Visit (INDEPENDENT_AMBULATORY_CARE_PROVIDER_SITE_OTHER)

## 2023-11-16 ENCOUNTER — Other Ambulatory Visit (HOSPITAL_COMMUNITY)
Admission: RE | Admit: 2023-11-16 | Discharge: 2023-11-16 | Disposition: A | Discharge status: Home / Self Care | Source: Ambulatory Visit | Attending: Certified Nurse Midwife | Admitting: Certified Nurse Midwife

## 2023-11-16 VITALS — BP 116/73 | HR 83 | Ht 69.0 in | Wt 175.0 lb

## 2023-11-16 DIAGNOSIS — Z113 Encounter for screening for infections with a predominantly sexual mode of transmission: Secondary | ICD-10-CM

## 2023-11-16 DIAGNOSIS — N898 Other specified noninflammatory disorders of vagina: Secondary | ICD-10-CM | POA: Diagnosis not present

## 2023-11-16 NOTE — Progress Notes (Signed)
    NURSE VISIT NOTE  Subjective:    Patient ID: Ruth Griffith, female    DOB: 02/02/1996, 28 y.o.   MRN: [id number]  HPI  Patient is a 28 y.o. G59P1001 female who presents for white vaginal discharge for 3 week(s). Denies abnormal vaginal bleeding or significant pelvic pain or fever. denies dysuria, hematuria, urinary frequency, urinary urgency, flank pain, abdominal pain, pelvic pain, cloudy malordorous urine, genital rash, and genital irritation. Patient does not have a history of known exposure to STD.   Objective:    BP 116/73   Pulse 83   Ht 5' 9 (1.753 m)   Wt 175 lb (79.4 kg)   LMP 10/27/2023   BMI 25.84 kg/m    No results found for any visits on 11/16/23.  Assessment:   1. Routine screening for STI (sexually transmitted infection)   2. Vaginal discharge   3. Vaginal odor       Plan:   GC and chlamydia DNA  probe sent to lab. Treatment: abstain from coitus during course of treatment ROV prn if symptoms persist or worsen.   Harlene Gander, CMA

## 2023-11-17 LAB — CERVICOVAGINAL ANCILLARY ONLY
Bacterial Vaginitis (gardnerella): POSITIVE — AB
Candida Glabrata: NEGATIVE
Candida Vaginitis: NEGATIVE
Chlamydia: NEGATIVE
Comment: NEGATIVE
Comment: NEGATIVE
Comment: NEGATIVE
Comment: NEGATIVE
Comment: NEGATIVE
Comment: NORMAL
Neisseria Gonorrhea: NEGATIVE
Trichomonas: NEGATIVE

## 2023-11-18 ENCOUNTER — Ambulatory Visit: Payer: Self-pay

## 2023-11-18 ENCOUNTER — Telehealth: Payer: Self-pay

## 2023-11-18 MED ORDER — METRONIDAZOLE 500 MG PO TABS: 500.0000 mg | ORAL_TABLET | Freq: Two times a day (BID) | ORAL | 0 refills | Status: AC

## 2023-11-18 NOTE — Telephone Encounter (Signed)
 Contacted patient through mychart and advised her of cervicovaginal swab that was done on 11/15/23, ( please see note) Prescription has been sent to pharmacy listed on file. KW
